# Patient Record
Sex: Female | Born: 1965 | Race: Black or African American | Hispanic: No | Smoking: Never smoker
Health system: Southern US, Community
[De-identification: ages and names within clinical notes are randomized; demographics above are authoritative.]

## PROBLEM LIST (undated history)

## (undated) DIAGNOSIS — I517 Cardiomegaly: Secondary | ICD-10-CM

## (undated) DIAGNOSIS — E119 Type 2 diabetes mellitus without complications: Secondary | ICD-10-CM

## (undated) DIAGNOSIS — N189 Chronic kidney disease, unspecified: Secondary | ICD-10-CM

## (undated) DIAGNOSIS — I10 Essential (primary) hypertension: Secondary | ICD-10-CM

## (undated) DIAGNOSIS — I160 Hypertensive urgency: Secondary | ICD-10-CM

## (undated) DIAGNOSIS — I119 Hypertensive heart disease without heart failure: Secondary | ICD-10-CM

## (undated) DIAGNOSIS — I890 Lymphedema, not elsewhere classified: Secondary | ICD-10-CM

## (undated) HISTORY — DX: Lymphedema, not elsewhere classified: I89.0

## (undated) HISTORY — DX: Chronic kidney disease, unspecified: N18.9

## (undated) HISTORY — DX: Cardiomegaly: I51.7

## (undated) HISTORY — DX: Hypertensive heart disease without heart failure: I11.9

## (undated) HISTORY — DX: Hypertensive urgency: I16.0

## (undated) HISTORY — PX: RHINOPLASTY: SUR1284

---

## 2000-05-20 ENCOUNTER — Inpatient Hospital Stay (HOSPITAL_COMMUNITY): Admission: AD | Admit: 2000-05-20 | Discharge: 2000-05-20 | Payer: Self-pay | Admitting: Obstetrics & Gynecology

## 2001-02-12 ENCOUNTER — Ambulatory Visit (HOSPITAL_COMMUNITY): Admission: RE | Admit: 2001-02-12 | Discharge: 2001-02-12 | Payer: Self-pay | Admitting: Family Medicine

## 2001-02-12 ENCOUNTER — Encounter: Payer: Self-pay | Admitting: Family Medicine

## 2001-03-17 ENCOUNTER — Emergency Department (HOSPITAL_COMMUNITY): Admission: EM | Admit: 2001-03-17 | Discharge: 2001-03-17 | Payer: Self-pay | Admitting: Emergency Medicine

## 2001-10-14 ENCOUNTER — Ambulatory Visit (HOSPITAL_COMMUNITY): Admission: RE | Admit: 2001-10-14 | Discharge: 2001-10-14 | Payer: Self-pay | Admitting: Family Medicine

## 2001-10-14 ENCOUNTER — Encounter: Payer: Self-pay | Admitting: Family Medicine

## 2001-10-22 ENCOUNTER — Ambulatory Visit (HOSPITAL_COMMUNITY): Admission: RE | Admit: 2001-10-22 | Discharge: 2001-10-22 | Payer: Self-pay | Admitting: Family Medicine

## 2001-10-22 ENCOUNTER — Encounter: Payer: Self-pay | Admitting: Family Medicine

## 2002-02-06 ENCOUNTER — Emergency Department (HOSPITAL_COMMUNITY): Admission: EM | Admit: 2002-02-06 | Discharge: 2002-02-06 | Payer: Self-pay | Admitting: Emergency Medicine

## 2002-02-06 ENCOUNTER — Encounter: Payer: Self-pay | Admitting: Emergency Medicine

## 2002-02-18 ENCOUNTER — Encounter: Payer: Self-pay | Admitting: Family Medicine

## 2002-02-18 ENCOUNTER — Ambulatory Visit (HOSPITAL_COMMUNITY): Admission: RE | Admit: 2002-02-18 | Discharge: 2002-02-18 | Payer: Self-pay | Admitting: Family Medicine

## 2003-10-01 ENCOUNTER — Ambulatory Visit (HOSPITAL_COMMUNITY): Admission: RE | Admit: 2003-10-01 | Discharge: 2003-10-01 | Payer: Self-pay | Admitting: *Deleted

## 2003-12-18 ENCOUNTER — Emergency Department (HOSPITAL_COMMUNITY): Admission: EM | Admit: 2003-12-18 | Discharge: 2003-12-18 | Payer: Self-pay | Admitting: Emergency Medicine

## 2004-01-05 ENCOUNTER — Ambulatory Visit (HOSPITAL_COMMUNITY): Admission: RE | Admit: 2004-01-05 | Discharge: 2004-01-05 | Payer: Self-pay | Admitting: Family Medicine

## 2004-01-19 ENCOUNTER — Emergency Department (HOSPITAL_COMMUNITY): Admission: EM | Admit: 2004-01-19 | Discharge: 2004-01-19 | Payer: Self-pay | Admitting: *Deleted

## 2008-11-20 ENCOUNTER — Ambulatory Visit: Payer: Self-pay | Admitting: Cardiology

## 2009-01-08 ENCOUNTER — Ambulatory Visit: Payer: Self-pay | Admitting: Cardiology

## 2010-10-16 ENCOUNTER — Encounter: Payer: Self-pay | Admitting: *Deleted

## 2011-02-07 NOTE — Assessment & Plan Note (Signed)
[facility]                          [facility] NOTE   NAME:[name], [name]                        MRN:          [id number]  DATE:01/08/2009                            DOB:          [birth year]    PRIMARY CARE PHYSICIAN:  Doreen Beam, MD   PRIMARY CARE PHYSICIAN:  Learta Codding, MD,FACC   REASON FOR VISIT:  Post hospital followup.   HISTORY OF PRESENT ILLNESS:  [name] [name] is a 45 year old woman seen by  Dr. Andee Lineman in consultation back in February with a history of chest pain  and palpitations in the setting of hypertension and diuretic use.  She  was noted to be hypokalemic at presentation and her cardiac markers  including troponin-I level and BNP were normal.  She also had a normal D-  dimer level.  She was referred for a 2-D echocardiogram, which revealed  normal left ventricular systolic function with an ejection fraction of  55-60% and no major valvular abnormalities.  Telemetry monitoring showed  no significant arrhythmia.  She was referred for a followup CardioNet  monitor, which revealed sinus rhythm with isolated premature ventricular  complex.  No sustained arrhythmias were noted.  She had a limited  episode of ventricular bigeminy.  In speaking with her about her  original presenting palpitations, she really describes more of a  sensation of fluttering and fast heart rate that was sustained and I  suppose it is possible that she had a supraventricular arrhythmia that  we did not capture.  In any event, she has remained symptomatically  stable since that time without any recurrence of chest pain or  palpitations and states that she feels back to baseline.  She also  states that Dr. Sherril Croon corrected her potassium with supplements.   ALLERGIES:  ACE INHIBITORS and CEFTIN.   PRESENT MEDICATIONS:  1. Triamterene 37.5 mg p.o. daily.  2. Metoprolol 50 mg p.o. b.i.d.  3. Amlodipine 10 mg p.o. daily.  4. Advil p.r.n.   REVIEW OF  SYSTEMS:  As outlined above.  Otherwise reviewed and negative.   PHYSICAL EXAMINATION:  VITAL SIGNS:  Blood pressure today is 136/86,  heart rate is 96, weight is 203 pounds.  GENERAL:  The patient is comfortable and in no acute distress.  NECK:  Examination of the neck shows no elevated jugular venous  pressure.  There is no thyromegaly.  LUNGS:  Clear without labored breathing.  CARDIAC:  Regular rate and rhythm.  No pathologic/systolic murmur or  pericardial rub.   IMPRESSION AND RECOMMENDATIONS:  History of palpitations and chest  discomfort with normal cardiac markers, normal left ventricular systolic  function without major valvular abnormalities on echocardiography, and  no sustained dysrhythmia is uncovered on outpatient CardioNet  monitoring.  She did have isolated premature ventricular complexes.  She  was hypokalemic on presentation, which may have contributed to some  myocardial irritability.  She states that this has been treated by Dr.  Sherril Croon subsequently and she has not had any recurrence of palpitations or  chest pain.  I have recommended observation and continuing medical  therapy.  If she develops any progressive/exertional symptoms, we can  certainly evaluate her further.  Followup will be as needed.     Jonelle Sidle, MD  Electronically Signed    SGM/MedQ  DD: 01/08/2009  DT: 01/09/2009  Job #: [id number]   cc:   Doreen Beam, MD

## 2011-05-03 DIAGNOSIS — R079 Chest pain, unspecified: Secondary | ICD-10-CM

## 2013-04-30 DIAGNOSIS — R030 Elevated blood-pressure reading, without diagnosis of hypertension: Secondary | ICD-10-CM

## 2014-05-07 DIAGNOSIS — I119 Hypertensive heart disease without heart failure: Secondary | ICD-10-CM | POA: Insufficient documentation

## 2014-05-07 DIAGNOSIS — R9431 Abnormal electrocardiogram [ECG] [EKG]: Secondary | ICD-10-CM | POA: Insufficient documentation

## 2015-06-01 ENCOUNTER — Other Ambulatory Visit (HOSPITAL_COMMUNITY): Payer: Self-pay | Admitting: Podiatry

## 2015-06-01 DIAGNOSIS — M6790 Unspecified disorder of synovium and tendon, unspecified site: Secondary | ICD-10-CM

## 2015-06-10 ENCOUNTER — Ambulatory Visit (HOSPITAL_COMMUNITY)
Admission: RE | Admit: 2015-06-10 | Discharge: 2015-06-10 | Disposition: A | Discharge status: Home / Self Care | Payer: BLUE CROSS/BLUE SHIELD | Source: Ambulatory Visit | Attending: Podiatry | Admitting: Podiatry

## 2015-06-10 DIAGNOSIS — M2141 Flat foot [pes planus] (acquired), right foot: Secondary | ICD-10-CM | POA: Diagnosis not present

## 2015-06-10 DIAGNOSIS — M6790 Unspecified disorder of synovium and tendon, unspecified site: Secondary | ICD-10-CM

## 2015-06-10 DIAGNOSIS — M19072 Primary osteoarthritis, left ankle and foot: Secondary | ICD-10-CM | POA: Insufficient documentation

## 2015-06-10 DIAGNOSIS — M25572 Pain in left ankle and joints of left foot: Secondary | ICD-10-CM | POA: Diagnosis not present

## 2015-06-10 DIAGNOSIS — M7660 Achilles tendinitis, unspecified leg: Secondary | ICD-10-CM | POA: Diagnosis not present

## 2015-10-22 ENCOUNTER — Other Ambulatory Visit: Payer: Self-pay | Admitting: *Deleted

## 2015-10-22 DIAGNOSIS — R609 Edema, unspecified: Secondary | ICD-10-CM

## 2015-12-03 ENCOUNTER — Encounter (HOSPITAL_COMMUNITY): Payer: Self-pay | Admitting: Emergency Medicine

## 2015-12-03 ENCOUNTER — Emergency Department (HOSPITAL_COMMUNITY)
Admission: EM | Admit: 2015-12-03 | Discharge: 2015-12-03 | Disposition: A | Discharge status: Home / Self Care | Payer: BLUE CROSS/BLUE SHIELD | Attending: Emergency Medicine | Admitting: Emergency Medicine

## 2015-12-03 DIAGNOSIS — R55 Syncope and collapse: Secondary | ICD-10-CM | POA: Diagnosis present

## 2015-12-03 DIAGNOSIS — I10 Essential (primary) hypertension: Secondary | ICD-10-CM | POA: Insufficient documentation

## 2015-12-03 DIAGNOSIS — E876 Hypokalemia: Secondary | ICD-10-CM | POA: Diagnosis not present

## 2015-12-03 DIAGNOSIS — E11649 Type 2 diabetes mellitus with hypoglycemia without coma: Secondary | ICD-10-CM | POA: Insufficient documentation

## 2015-12-03 DIAGNOSIS — Z79899 Other long term (current) drug therapy: Secondary | ICD-10-CM | POA: Diagnosis not present

## 2015-12-03 DIAGNOSIS — Z7984 Long term (current) use of oral hypoglycemic drugs: Secondary | ICD-10-CM | POA: Diagnosis not present

## 2015-12-03 DIAGNOSIS — E162 Hypoglycemia, unspecified: Secondary | ICD-10-CM

## 2015-12-03 HISTORY — DX: Essential (primary) hypertension: I10

## 2015-12-03 HISTORY — DX: Type 2 diabetes mellitus without complications: E11.9

## 2015-12-03 LAB — CBC WITH DIFFERENTIAL/PLATELET
BASOS ABS: 0 10*3/uL (ref 0.0–0.1)
BASOS PCT: 0 %
Eosinophils Absolute: 0 10*3/uL (ref 0.0–0.7)
Eosinophils Relative: 1 %
HEMATOCRIT: 35.3 % — AB (ref 36.0–46.0)
Hemoglobin: 10.9 g/dL — ABNORMAL LOW (ref 12.0–15.0)
LYMPHS PCT: 14 %
Lymphs Abs: 0.9 10*3/uL (ref 0.7–4.0)
MCH: 23.6 pg — ABNORMAL LOW (ref 26.0–34.0)
MCHC: 30.9 g/dL (ref 30.0–36.0)
MCV: 76.6 fL — ABNORMAL LOW (ref 78.0–100.0)
MONO ABS: 0.4 10*3/uL (ref 0.1–1.0)
Monocytes Relative: 5 %
NEUTROS ABS: 5.5 10*3/uL (ref 1.7–7.7)
NEUTROS PCT: 80 %
Platelets: 321 10*3/uL (ref 150–400)
RBC: 4.61 MIL/uL (ref 3.87–5.11)
RDW: 18.8 % — AB (ref 11.5–15.5)
WBC: 6.9 10*3/uL (ref 4.0–10.5)

## 2015-12-03 LAB — BASIC METABOLIC PANEL
ANION GAP: 10 (ref 5–15)
BUN: 15 mg/dL (ref 6–20)
CALCIUM: 8.6 mg/dL — AB (ref 8.9–10.3)
CO2: 27 mmol/L (ref 22–32)
Chloride: 100 mmol/L — ABNORMAL LOW (ref 101–111)
Creatinine, Ser: 1.13 mg/dL — ABNORMAL HIGH (ref 0.44–1.00)
GFR, EST NON AFRICAN AMERICAN: 57 mL/min — AB (ref >60)
GLUCOSE: 183 mg/dL — AB (ref 65–99)
POTASSIUM: 3.4 mmol/L — AB (ref 3.5–5.1)
Sodium: 137 mmol/L (ref 135–145)

## 2015-12-03 LAB — CBG MONITORING, ED
Glucose-Capillary: 126 mg/dL — ABNORMAL HIGH (ref 65–99)
Glucose-Capillary: 156 mg/dL — ABNORMAL HIGH (ref 65–99)

## 2015-12-03 NOTE — Discharge Instructions (Signed)

## 2015-12-03 NOTE — ED Notes (Signed)
Pt states she felt like her sugar dropped, pt better, just sleepy. Family at the bedside.

## 2015-12-03 NOTE — ED Notes (Signed)
Order meal tray per MD

## 2015-12-03 NOTE — ED Notes (Signed)
Patient was at work and had a loss of consciousness. Felt as if she was having a hypoglycemia episode. Was given juice and a glucose tablet. Patient is Diabetic and takes metformin. Reports eating breakfast and taking metformin per her normal routine. States she felt as if she was getting a cold today. States headache.

## 2015-12-03 NOTE — ED Provider Notes (Signed)
CSN: [id number]     Arrival date & time 12/03/15  1152 History  By signing my name below, I, Marica OtterNusrat Rahman, attest that this documentation has been prepared under the direction and in the presence of No att. providers found. Electronically Signed: Marica OtterNusrat Rahman, ED Scribe. 12/03/2015. 12:35 PM.  Chief Complaint  Patient presents with  . Loss of Consciousness   The history is provided by the patient. No language interpreter was used.   PCP: Ignatius SpeckingVYAS,DHRUV B., MD HPI Comments: [name] [name] is a 50 y.o. female, with PMHx noted below including DM (treated with Metformin) and HTN, who presents to the Emergency Department complaining of one episode of syncope this morning while at work. Associated Sx include headache. Pt reports that immediately preceding losing consciousness she felt dizzy and felt like she was having a hypoglycemic episode. Pt reports drinking orange juice and taking a glucose tab after regaining consciousness. Pt reports eating breakfast this morning and taking her metformin per her daily morning routine. Pt notes she has also been coughing for the past couple of days. Pt denies fever, chest pain, dysuria, confusion, frequency, difficulty urinating. Pt further denies any recent medication changes.   Past Medical History  Diagnosis Date  . Diabetes mellitus without complication (HCC)   . Hypertension    Past Surgical History  Procedure Laterality Date  . Rhinoplasty     No family history on file. Social History  Substance Use Topics  . Smoking status: Never Smoker   . Smokeless tobacco: None  . Alcohol Use: No   OB History    No data available     Review of Systems  Constitutional: Negative for fever.  Respiratory: Positive for cough.   Cardiovascular: Negative for chest pain.  Genitourinary: Negative for dysuria, frequency and difficulty urinating.  Neurological: Positive for syncope and headaches.  Psychiatric/Behavioral: Negative for confusion.  All other systems  reviewed and are negative.  Allergies  Ceftin and Ace inhibitors  Home Medications   Prior to Admission medications   Medication Sig Start Date End Date Taking? Authorizing Provider  amLODipine (NORVASC) 10 MG tablet Take 10 mg by mouth daily.   Yes Historical Provider, MD  cloNIDine (CATAPRES - DOSED IN MG/24 HR) 0.2 mg/24hr patch Place 1 patch onto the skin once a week. 10/05/15  Yes Historical Provider, MD  fluticasone (FLONASE) 50 MCG/ACT nasal spray Place 1 spray into both nostrils daily as needed for allergies or rhinitis.   Yes Historical Provider, MD  metFORMIN (GLUCOPHAGE) 500 MG tablet Take 500 mg by mouth 2 (two) times daily with a meal.   Yes Historical Provider, MD  metoprolol (LOPRESSOR) 50 MG tablet Take 50 mg by mouth 2 (two) times daily. 09/25/15  Yes Historical Provider, MD  PENNSAID 2 % SOLN Apply 2 application topically daily as needed. 11/11/15  Yes Historical Provider, MD  torsemide (DEMADEX) 20 MG tablet Take 1 tablet by mouth daily as needed (fluid).  09/25/15   Historical Provider, MD   Triage Vitals: BP 110/64 mmHg  Pulse 77  Temp(Src) 98.6 F (37 C) (Oral)  Resp 16  Ht 5\' 3"  (1.6 m)  Wt 215 lb (97.523 kg)  BMI 38.09 kg/m2  SpO2 100% Physical Exam  Constitutional: She is oriented to person, place, and time. She appears well-developed and well-nourished. No distress.  Appears fatigued.   HENT:  Head: Normocephalic.  Eyes: EOM are normal.  Neck: Normal range of motion.  Cardiovascular: Normal rate, regular rhythm and normal heart sounds.  Pulmonary/Chest: Effort normal and breath sounds normal. No respiratory distress.  Abdominal: She exhibits no distension.  Musculoskeletal: Normal range of motion.  Neurological: She is alert and oriented to person, place, and time.  Psychiatric: She has a normal mood and affect.  Nursing note and vitals reviewed.  ED Course  Procedures (including critical care time) DIAGNOSTIC STUDIES: Oxygen Saturation is 100% on  ra, nl by my interpretation.    COORDINATION OF CARE: 12:31 PM: Discussed treatment plan which includes labs and EKG results with pt at bedside; patient verbalizes understanding and agrees with treatment plan.  Labs Review Labs Reviewed  BASIC METABOLIC PANEL - Abnormal; Notable for the following:    Potassium 3.4 (*)    Chloride 100 (*)    Glucose, Bld 183 (*)    Creatinine, Ser 1.13 (*)    Calcium 8.6 (*)    GFR calc non Af Amer 57 (*)    All other components within normal limits  CBC WITH DIFFERENTIAL/PLATELET - Abnormal; Notable for the following:    Hemoglobin 10.9 (*)    HCT 35.3 (*)    MCV 76.6 (*)    MCH 23.6 (*)    RDW 18.8 (*)    All other components within normal limits  CBG MONITORING, ED - Abnormal; Notable for the following:    Glucose-Capillary 126 (*)    All other components within normal limits  CBG MONITORING, ED - Abnormal; Notable for the following:    Glucose-Capillary 156 (*)    All other components within normal limits   I have personally reviewed and evaluated these lab results as part of my medical decision-making.   EKG Interpretation   Date/Time:  Friday December 03 2015 12:19:20 EST Ventricular Rate:  76 PR Interval:  191 QRS Duration: 72 QT Interval:  471 QTC Calculation: 530 R Axis:   23 Text Interpretation:  Sinus rhythm Left atrial enlargement Left  ventricular hypertrophy Borderline T abnormalities, anterior leads  Prolonged QT interval Confirmed by Rubin Payor  MD, Harrold Donath [id number]) on  12/03/2015 12:33:32 PM      MDM   Final diagnoses:  Hypoglycemia  Hypokalemia    Patient with likely hypoglycemia. Mild hypokalemia. Feels beter after treatment. Will d/c  I personally performed the services described in this documentation, which was scribed in my presence. The recorded information has been reviewed and is accurate.      Benjiman Core, MD 12/03/15 2221

## 2015-12-03 NOTE — ED Notes (Signed)
MD at bedside. 

## 2015-12-03 NOTE — ED Notes (Signed)
Patient given discharge instruction, verbalized understand. IV removed, band aid applied. Patient ambulatory out of the department.  

## 2015-12-03 NOTE — ED Notes (Signed)
Pt eating meal tray 

## 2015-12-08 ENCOUNTER — Encounter: Payer: Self-pay | Admitting: Vascular Surgery

## 2015-12-14 ENCOUNTER — Encounter (HOSPITAL_COMMUNITY): Payer: BLUE CROSS/BLUE SHIELD

## 2015-12-14 ENCOUNTER — Encounter: Payer: BLUE CROSS/BLUE SHIELD | Admitting: Vascular Surgery

## 2016-04-07 ENCOUNTER — Encounter: Payer: Self-pay | Admitting: Vascular Surgery

## 2016-04-12 ENCOUNTER — Ambulatory Visit (INDEPENDENT_AMBULATORY_CARE_PROVIDER_SITE_OTHER): Payer: BLUE CROSS/BLUE SHIELD | Admitting: Vascular Surgery

## 2016-04-12 ENCOUNTER — Ambulatory Visit (HOSPITAL_COMMUNITY)
Admission: RE | Admit: 2016-04-12 | Discharge: 2016-04-12 | Disposition: A | Discharge status: Home / Self Care | Payer: BLUE CROSS/BLUE SHIELD | Source: Ambulatory Visit | Attending: Vascular Surgery | Admitting: Vascular Surgery

## 2016-04-12 ENCOUNTER — Encounter: Payer: Self-pay | Admitting: Vascular Surgery

## 2016-04-12 VITALS — BP 160/103 | HR 98 | Temp 97.3°F | Resp 14 | Ht 67.0 in | Wt 223.0 lb

## 2016-04-12 DIAGNOSIS — I872 Venous insufficiency (chronic) (peripheral): Secondary | ICD-10-CM | POA: Diagnosis not present

## 2016-04-12 DIAGNOSIS — R609 Edema, unspecified: Secondary | ICD-10-CM | POA: Insufficient documentation

## 2016-04-12 DIAGNOSIS — I8393 Asymptomatic varicose veins of bilateral lower extremities: Secondary | ICD-10-CM | POA: Insufficient documentation

## 2016-04-12 DIAGNOSIS — I10 Essential (primary) hypertension: Secondary | ICD-10-CM | POA: Insufficient documentation

## 2016-04-12 DIAGNOSIS — E119 Type 2 diabetes mellitus without complications: Secondary | ICD-10-CM | POA: Diagnosis not present

## 2016-04-12 NOTE — Progress Notes (Signed)
[facility] [place]  Patient name: [name] [name] MRN: [id number] DOB: [birth year] Sex: female  REASON FOR CONSULT: Bilateral lower extremity edema. Referred by Dr. Nolen Mu.  HPI: [name] [name] is a 50 y.o. female, who is referred for evaluation of bilateral lower extremity swelling. She works at a job that requires her to stand for a prolonged period of time and has gradually noted increasing swelling in both lower extremities. The left leg has been or significant than the right. She denies any previous history of DVT or phlebitis. She describes aching pain and heaviness associated with standing and relieved somewhat with elevation. There are no other aggravating or alleviating factors.  I have reviewed the records that were sent with her. She does have a history of foot pain. She has posterior tibial tendon dysfunction.  Past Medical History  Diagnosis Date  . Diabetes mellitus without complication (HCC)   . Hypertension     Family History  Problem Relation Age of Onset  . Diabetes Mother   . Kidney disease Mother   . Diabetes Father   . Heart disease Father     SOCIAL HISTORY: Social History   Social History  . Marital Status: Legally Separated    Spouse Name: N/A  . Number of Children: N/A  . Years of Education: N/A   Occupational History  . Not on file.   Social History Main Topics  . Smoking status: Never Smoker   . Smokeless tobacco: Not on file  . Alcohol Use: No  . Drug Use: No  . Sexual Activity: Not on file   Other Topics Concern  . Not on file   Social History Narrative    Allergies  Allergen Reactions  . Ace Inhibitors Swelling and Shortness Of Breath  . Cefoxitin Shortness Of Breath  . Ceftin [Cefuroxime Axetil] Anaphylaxis    Current Outpatient Prescriptions  Medication Sig Dispense Refill  . acetaminophen (TYLENOL) 500 MG tablet Take 500 mg by mouth as needed.    Marland Kitchen amLODipine (NORVASC) 10 MG tablet Take 10 mg by  mouth daily.    . cloNIDine (CATAPRES - DOSED IN MG/24 HR) 0.2 mg/24hr patch Place 1 patch onto the skin once a week.  12  . fluticasone (FLONASE) 50 MCG/ACT nasal spray Place 1 spray into both nostrils daily as needed for allergies or rhinitis.    Marland Kitchen HYDROcodone-acetaminophen (NORCO/VICODIN) 5-325 MG tablet TAKE 1 TABLET BY MOUTH FOUR TIMES A DAY AS NEEDED  0  . Ibuprofen (ADVIL) 200 MG CAPS Take 200 mg by mouth as needed.    . metFORMIN (GLUCOPHAGE) 500 MG tablet Take 500 mg by mouth 2 (two) times daily with a meal.    . metoprolol (LOPRESSOR) 50 MG tablet Take 50 mg by mouth 2 (two) times daily.  1  . PENNSAID 2 % SOLN Apply 2 application topically daily as needed.  0  . torsemide (DEMADEX) 20 MG tablet Take 1 tablet by mouth daily as needed (fluid).   2   No current facility-administered medications for this visit.    REVIEW OF SYSTEMS:   denotes positive finding,  denotes negative finding Cardiac  Comments:  Chest pain or chest pressure:    Shortness of breath upon exertion:    Short of breath when lying flat:    Irregular heart rhythm:        Vascular    Pain in calf, thigh, or hip brought on by ambulation:    Pain in  feet at night that wakes you up from your sleep:     Blood clot in your veins:    Leg swelling:  X       Pulmonary    Oxygen at home:    Productive cough:     Wheezing:         Neurologic    Sudden weakness in arms or legs:     Sudden numbness in arms or legs:     Sudden onset of difficulty speaking or slurred speech:    Temporary loss of vision in one eye:     Problems with dizziness:         Gastrointestinal    Blood in stool:     Vomited blood:         Genitourinary    Burning when urinating:     Blood in urine:        Psychiatric    Major depression:         Hematologic    Bleeding problems:    Problems with blood clotting too easily:        Skin    Rashes or ulcers: X       Constitutional    Fever or chills:      PHYSICAL  EXAM: Filed Vitals:   04/12/16 1300 04/12/16 1304  BP: 187/107 160/103  Pulse: 97 98  Temp: 97.3 F (36.3 C)   Resp: 14   Height: 5\' 7"  (1.702 m)   Weight: 223 lb (101.152 kg)   SpO2: 98%     GENERAL: The patient is a well-nourished female, in no acute distress. The vital signs are documented above. CARDIAC: There is a regular rate and rhythm.  VASCULAR: not detect carotid bruits. He has palpable pedal pulses. She has mild bilateral lower extremity swelling. PULMONARY: There is good air exchange bilaterally without wheezing or rales. ABDOMEN: Soft and non-tender with normal pitched bowel sounds.  MUSCULOSKELETAL: There are no major deformities or cyanosis. NEUROLOGIC: No focal weakness or paresthesias are detected. SKIN: There are no ulcers or rashes noted. PSYCHIATRIC: The patient has a normal affect.  DATA:   LOWER EXTREMITY VENOUS DUPLEX: I have independently interpreted her lower extremity venous duplex scan done today.  On the right side, there is no evidence of DVT or superficial thrombophlebitis. There is reflux in the deep system involving the right common femoral vein. There is also reflux in the right small saphenous vein.  On the left side, there is no evidence of DVT or superficial thrombophlebitis. There is reflux in the deep system involving the common femoral vein and femoral vein on the left. There is no reflux in the great saphenous vein or small saphenous vein on the left.  MEDICAL ISSUES:  CHRONIC VENOUS INSUFFICIENCY: Based on her duplex study she has evidence of bilateral chronic venous insufficiency. This patient has significant chronic venous insufficiency. I've explained that this is a chronic problem. We have discussed the importance of intermittent leg elevation and the proper positioning for this. In addition, I have discussed the importance of wearing compression stockings, she has, when she will be standing or sitting for a long time.  I have encouraged  her to stay as active as possible and to avoid prolonged sitting and standing. We have also discussed water aerobics which I think is also very helpful for people with chronic venous insufficiency. I will be happy to see her back at anytime if any new vascular issues arise.  Waverly Ferrari  [facility] [place] [facility] [phone]

## 2016-04-12 NOTE — Progress Notes (Signed)
Filed Vitals:   04/12/16 1300 04/12/16 1304  BP: 187/107 160/103  Pulse: 97 98  Temp: 97.3 F (36.3 C)   Resp: 14   Height: 5\' 7"  (1.702 m)   Weight: 223 lb (101.152 kg)   SpO2: 98%

## 2017-06-15 ENCOUNTER — Encounter: Payer: Self-pay | Admitting: Orthopedic Surgery

## 2017-06-15 ENCOUNTER — Telehealth: Payer: Self-pay | Admitting: Orthopedic Surgery

## 2017-06-15 ENCOUNTER — Ambulatory Visit (INDEPENDENT_AMBULATORY_CARE_PROVIDER_SITE_OTHER): Payer: Worker's Compensation | Admitting: Orthopedic Surgery

## 2017-06-15 VITALS — BP 198/116 | HR 76 | Ht 64.0 in | Wt 216.0 lb

## 2017-06-15 DIAGNOSIS — S83242A Other tear of medial meniscus, current injury, left knee, initial encounter: Secondary | ICD-10-CM | POA: Diagnosis not present

## 2017-06-15 DIAGNOSIS — S8002XA Contusion of left knee, initial encounter: Secondary | ICD-10-CM | POA: Diagnosis not present

## 2017-06-15 NOTE — Patient Instructions (Signed)
Meniscus Injury, Arthroscopy Arthroscopy is a surgical procedure that involves the use of a small scope that has a camera and surgical instruments on the end (arthroscope). An arthroscope can be used to repair your meniscus injury.  LET YOUR HEALTH CARE PROVIDER KNOW ABOUT:  Any allergies you have.  All medicines you are taking, including vitamins, herbs, eyedrops, creams, and over-the-counter medicines.  Any recent colds or infections you have had or currently have.  Previous problems you or members of your family have had with the use of anesthetics.  Any blood disorders or blood clotting problems you have.  Previous surgeries you have had.  Medical conditions you have. RISKS AND COMPLICATIONS Generally, this is a safe procedure. However, as with any procedure, problems can occur. Possible problems include:  Damage to nerves or blood vessels.  Excess bleeding.  Blood clots.  Infection. BEFORE THE PROCEDURE  Do not eat or drink for 6-8 hours before the procedure.  Take medicines as directed by your surgeon. Ask your surgeon about changing or stopping your regular medicines.  You may have lab tests the morning of surgery. PROCEDURE  You will be given one of the following:   A medicine that numbs the area (local anesthesia).  A medicine that makes you go to sleep (general anesthesia).  A medicine injected into your spine that numbs your body below the waist (spinal anesthesia). Most often, several small cuts (incisions) are made in the knee. The arthroscope and instruments go into the incisions to repair the damage. The torn portion of the meniscus is removed.   AFTER THE PROCEDURE  You will be taken to the recovery area where your progress will be monitored. When you are awake, stable, and taking fluids without complications, you will be allowed to go home. This is usually the same day. A torn or stretched ligament (ligament sprain) may take 6-8 weeks to heal.   It  takes about the 4-6 WEEKS if your surgeon removed a torn meniscus.  A repaired meniscus may require 6-12 weeks of recovery time.  A torn ligament needing reconstructive surgery may take 6-12 months to heal fully.   This information is not intended to replace advice given to you by your health care provider. Make sure you discuss any questions you have with your health care provider. You have decided to proceed with operative arthroscopy of the knee. You have decided not to continue with nonoperative measures such as but not limited to oral medication, weight loss, activity modification, physical therapy, bracing, or injection.  We will perform operative arthroscopy of the knee. Some of the risks associated with arthroscopic surgery of the knee include but are not limited to Bleeding Infection Swelling Stiffness Blood clot Pain  If you're not comfortable with these risks and would like to continue with nonoperative treatment please let Dr. Tae Robak know prior to your surgery.   Document Released: 09/08/2000 Document Revised: 09/16/2013 Document Reviewed: 02/07/2013 Elsevier Interactive Patient Education 2016 [employer] You have decided to proceed with operative arthroscopy of the knee. You have decided not to continue with nonoperative measures such as but not limited to oral medication, weight loss, activity modification, physical therapy, bracing, or injection.  We will perform operative arthroscopy of the knee. Some of the risks associated with arthroscopic surgery of the knee include but are not limited to Bleeding Infection Swelling Stiffness Blood clot Pain  If you're not comfortable with these risks and would like to continue with nonoperative treatment please let Dr. Antonis Lor   know prior to your surgery. 

## 2017-06-15 NOTE — Addendum Note (Signed)
Addended by: Fuller Canada E on: 06/15/2017 11:37 AM   Modules accepted: Orders, SmartSet

## 2017-06-15 NOTE — Telephone Encounter (Signed)
-----   Message from Vickki Hearing, MD sent at 06/15/2017  9:53 AM EDT ----- Schedule surgery

## 2017-06-15 NOTE — Progress Notes (Signed)
Patient ID: [name] [name], female   DOB: 1966/09/09, 51 y.o.   MRN: [id number]   Chief Complaint  Patient presents with  . Knee Injury    Left knee pain, DOI 04-16-17. Workers compensation    [name] [name] is a 51 y.o. female.   51 year old female presents with a left knee injury sustained at work on generalized 23rd she tripped over some debris in the plant and fell onto her left knee.  She is on light duty from 6 July 23 through today. She's had ibuprofen ice and Aleve she's had an MRI in her MRI shows that she has a torn medial meniscus as well as injury to Hoffa's fat pad inferior pole of patella ligaments are normal  Currently primary symptoms include anterior knee pain aching throbbing during the day worse at night  Mild medial compartment pain as well. Constant pain is noted dull aching sensation noted.    Review of Systems Review of Systems  Cardiovascular: Positive for leg swelling.  Musculoskeletal: Positive for joint pain.  All other systems reviewed and are negative.   Past Medical History:  Diagnosis Date  . Diabetes mellitus without complication (HCC)   . Hypertension     Past Surgical History:  Procedure Laterality Date  . CESAREAN SECTION  2001  . RHINOPLASTY      Allergies  Allergen Reactions  . Ace Inhibitors Swelling and Shortness Of Breath  . Cefoxitin Shortness Of Breath  . Ceftin [Cefuroxime Axetil] Anaphylaxis    Current Outpatient Prescriptions  Medication Sig Dispense Refill  . acetaminophen (TYLENOL) 500 MG tablet Take 500 mg by mouth as needed.    Marland Kitchen amLODipine (NORVASC) 10 MG tablet Take 10 mg by mouth daily.    . cloNIDine (CATAPRES - DOSED IN MG/24 HR) 0.2 mg/24hr patch Place 1 patch onto the skin once a week.  12  . fluticasone (FLONASE) 50 MCG/ACT nasal spray Place 1 spray into both nostrils daily as needed for allergies or rhinitis.    Marland Kitchen HYDROcodone-acetaminophen (NORCO/VICODIN) 5-325 MG tablet TAKE 1 TABLET BY MOUTH FOUR TIMES A  DAY AS NEEDED  0  . Ibuprofen (ADVIL) 200 MG CAPS Take 200 mg by mouth as needed.    . metFORMIN (GLUCOPHAGE) 500 MG tablet Take 500 mg by mouth 2 (two) times daily with a meal.    . metoprolol (LOPRESSOR) 50 MG tablet Take 50 mg by mouth 2 (two) times daily.  1  . PENNSAID 2 % SOLN Apply 2 application topically daily as needed.  0  . torsemide (DEMADEX) 20 MG tablet Take 1 tablet by mouth daily as needed (fluid).   2   No current facility-administered medications for this visit.      Physical Exam BP (!) 198/116   Pulse 76   Ht  (1.626 m)   Wt 216 lb (98 kg)   BMI 37.08 kg/m  Physical Exam   The patient is well developed well nourished and well groomed.   Orientation to person place and time is normal   Mood is pleasant. Affect normal  Ambulatory status is remarkable for a limp in the involved extremity         Left Knee examination:  Inspection: Tenderness is noted over the medial joint line   ROM: Is limited by pain with a maximum flexion arc of   St 130ability: Collateral ligaments are stable, the Lachman test and anterior and posterior drawer tests are normal   We do palpate medial joint  line tenderness and a positive McMurray's for medial meniscal tear, we also find tenderness in the inferior pole patella medial and lateral side and patellar tendon    Motor exam: Grade 5 motor strength in the quadriceps musculature   Skin: Warm dry and intact over the right leg                       Neuro: normal sensation   Vascular: 2+ DP pulse with normal color and no edema.   Currently theright lower extremity and knee examination revealed no tenderness or swelling, full range of motion without contracture subluxation atrophy or tremor. Normal muscle tone no instability and the neurovascular status of the limb is normal.    Assessment and Plan:  IMAGING: I have read and interpret the MRI scan shows a probable medial meniscus tear. There is some retropatellar  tendon and Hoffa's fat pad injury as well   Diagnosis and treatment:  Encounter Diagnoses  Name Primary?  . Acute medial meniscus tear of left knee, initial encounter Yes  . Contusion of left patella, initial encounter       Fuller Canada, MD 06/15/2017 9:08 AM

## 2017-06-15 NOTE — Pre-Procedure Instructions (Signed)
error 

## 2017-06-21 ENCOUNTER — Telehealth: Payer: Self-pay | Admitting: Orthopedic Surgery

## 2017-06-21 NOTE — Telephone Encounter (Signed)
On Wednesday 06-20-17, [name] [name] came by and gave [place] the name of a new WC adjustor for her.  The adjustor's name is [name] [name] and her phone number is [phone].  Patient's nurse case manager is [name] phone # [phone].   I called [name] [name] this morning and asked her to call and give approval for pt's preop appointment tomorrow, 06-22-17 at 12:45 and her surgery on 06-28-17.   I had a voicemail left during lunch from [name] nurse case Production designer, theatre/television/film.  She said the case was under review.    I called [name] to clarify as to whether or not to cancel pt's pre op appointment or not.  I could not speak to her but did speak to another representative, [name].  She got through to [name] who stated there was a pending referral to [name].  Per [name], pre op appointment needs to be canceled.  I called and spoke to [name] [phone]).  I let her know what is going on with this WC case at the present time.  She has canceled pre op for tomorrow.  We will have to await further word from [name] to see if new pre op appointment and surgery need to be canceled or proceed as planned.

## 2017-06-21 NOTE — Pre-Procedure Instructions (Signed)
Spoke with Dr Romeo Apple about order for obtain consent. Order entered per his instructions.

## 2017-06-21 NOTE — Patient Instructions (Signed)
[name] [name]  06/21/2017     @   Your procedure is scheduled on  06/28/2017   Report to [facility] at  725  A.M.  Call this number if you have problems the morning of surgery:  [phone]   Remember:  Do not eat food or drink liquids after midnight.  Take these medicines the morning of surgery with A SIP OF WATER  Norvasc, hydrocodone, metoprolol.   Do not wear jewelry, make-up or nail polish.  Do not wear lotions, powders, or perfumes, or deoderant.  Do not shave 48 hours prior to surgery.  Men may shave face and neck.  Do not bring valuables to the hospital.  [facility] is not responsible for any belongings or valuables.  Contacts, dentures or bridgework may not be worn into surgery.  Leave your suitcase in the car.  After surgery it may be brought to your room.  For patients admitted to the hospital, discharge time will be determined by your treatment team.  Patients discharged the day of surgery will not be allowed to drive home.   Name and phone number of your driver:   family Special instructions:  None  Please read over the following fact sheets that you were given. Anesthesia Post-op Instructions and Care and Recovery After Surgery      Knee Ligament Injury, Arthroscopy Arthroscopy is a surgical technique in which your health care provider examines your knee through a small, pencil-sized telescope (arthroscope). Often, repairs to injured ligaments can be done with instruments in the arthroscope. Arthroscopy is less invasive than open-knee surgery. Tell a health care provider about:  Any allergies you have.  All medicines you are taking, including vitamins, herbs, eye drops, creams, and over-the-counter medicines.  Any problems you or family members have had with anesthetic medicines.  Any blood disorders you have.  Any surgeries you have had.  Any medical conditions you have. What are the risks? Generally, this is a  safe procedure. However, as with any procedure, problems can occur. Possible problems include:  Infection.  Bleeding.  Stiffness.  What happens before the procedure?  Ask your health care provider about changing or stopping any regular medicines. Avoid taking aspirin or blood thinners as directed by your health care provider.  Do not eat or drink anything after midnight the night before surgery.  If you smoke, do not smoke for at least 2 weeks before your surgery.  Do not drink alcohol starting the day before your surgery.  Let your health care provider know if you develop a cold or any infection before your surgery.  Arrange for someone to drive you home after the surgery or after your hospital stay. Also arrange for someone to help you with activities during recovery. What happens during the procedure?  Small monitors will be put on your body. They are used to check your heart, blood pressure, and oxygen levels.  An IV access tube will be put into one of your veins. Medicine will be able to flow directly into your body through this IV tube.  You might be given a medicine to help you relax (sedative).  You will be given a medicine that makes you go to sleep (general anesthetic), and a breathing tube will be placed into your lungs during the procedure.  Several small incisions are made in your knee. Saline fluid is placed into one of the incisions to expand the knee and clear  away any blood in the knee.  Your health care provider will insert the arthroscope to examine the injured knee.  During arthroscopy, your health care provider may find a partial or complete tear in a ligament.  Tools can be inserted through the other incisions to repair the injured ligaments.  The incisions are then closed with absorbable stitches and covered with dressings. What happens after the procedure?  You will be taken to the recovery area where you will be monitored.  When you are awake,  stable, and taking fluids without problems, you will be allowed to go home. This information is not intended to replace advice given to you by your health care provider. Make sure you discuss any questions you have with your health care provider. Document Released: 09/08/2000 Document Revised: 02/17/2016 Document Reviewed: 04/23/2013 Elsevier Interactive Patient Education  2017 Elsevier Inc.  Arthroscopic Knee Ligament Repair, Care After This sheet gives you information about how to care for yourself after your procedure. Your health care provider may also give you more specific instructions. If you have problems or questions, contact your health care provider. What can I expect after the procedure? After the procedure, it is common to have:  Pain in your knee.  Bruising and swelling on your knee, calf, and ankle for 3-4 days.  Fatigue.  Follow these instructions at home: If you have a brace or immobilizer:  Wear the brace or immobilizer as told by your health care provider. Remove it only as told by your health care provider.  Loosen the splint or immobilizer if your toes tingle, become numb, or turn cold and blue.  Keep the brace or immobilizer clean. Bathing  Do not take baths, swim, or use a hot tub until your health care provider approves. Ask your health care provider if you can take showers.  Keep your bandage (dressing) dry until your health care provider says that it can be removed. Cover it and your brace or immobilizer with a watertight covering when you take a shower. Incision care  Follow instructions from your health care provider about how to take care of your incision. Make sure you: ? Wash your hands with soap and water before you change your bandage (dressing). If soap and water are not available, use hand sanitizer. ? Change your dressing as told by your health care provider. ? Leave stitches (sutures), skin glue, or adhesive strips in place. These skin closures  may need to stay in place for 2 weeks or longer. If adhesive strip edges start to loosen and curl up, you may trim the loose edges. Do not remove adhesive strips completely unless your health care provider tells you to do that.  Check your incision area every day for signs of infection. Check for: ? More redness, swelling, or pain. ? More fluid or blood. ? Warmth. ? Pus or a bad smell. Managing pain, stiffness, and swelling  If directed, put ice on the affected area. ? If you have a removable brace or immobilizer, remove it as told by your health care provider. ? Put ice in a plastic bag. ? Place a towel between your skin and the bag or between your brace or immobilizer and the bag. ? Leave the ice on for 20 minutes, 2-3 times a day.  Move your toes often to avoid stiffness and to lessen swelling.  Raise (elevate) the injured area above the level of your heart while you are sitting or lying down. Driving  Do not drive until  your health care provider approves. If you have a brace or immobilizer on your leg, ask your health care provider when it is safe for you to drive.  Do not drive or use heavy machinery while taking prescription pain medicine. Activity  Rest as directed. Ask your health care provider what activities are safe for you.  Do physical therapy exercises as told by your health care provider. Physical therapy will help you regain strength and motion in your knee.  Follow instructions from your health care provider about: ? When you may start motion exercises. ? When you may start riding a stationary bike and doing other low-impact activities. ? When you may start to jog and do other high-impact activities. Safety  Do not use the injured limb to support your body weight until your health care provider says that you can. Use crutches as told by your health care provider. General instructions  Do not use any products that contain nicotine or tobacco, such as cigarettes  and e-cigarettes. These can delay bone healing. If you need help quitting, ask your health care provider.  To prevent or treat constipation while you are taking prescription pain medicine, your health care provider may recommend that you: ? Drink enough fluid to keep your urine clear or pale yellow. ? Take over-the-counter or prescription medicines. ? Eat foods that are high in fiber, such as fresh fruits and vegetables, whole grains, and beans. ? Limit foods that are high in fat and processed sugars, such as fried and sweet foods.  Take over-the-counter and prescription medicines only as told by your health care provider.  Keep all follow-up visits as told by your health care provider. This is important. Contact a health care provider if:  You have more redness, swelling, or pain around an incision.  You have more fluid or blood coming from an incision.  Your incision feels warm to the touch.  You have a fever.  You have pain or swelling in your knee, and it gets worse.  You have pain that does not get better with medicine. Get help right away if:  You have trouble breathing.  You have pus or a bad smell coming from an incision.  You have numbness and tingling near the knee joint. Summary  After the procedure, it is common to have knee pain with bruising and swelling on your knee, calf, and ankle.  Icing your knee and raising your leg above the level of your heart will help control the pain and the swelling.  Do physical therapy exercises as told by your health care provider. Physical therapy will help you regain strength and motion in your knee. This information is not intended to replace advice given to you by your health care provider. Make sure you discuss any questions you have with your health care provider. Document Released: 07/02/2013 Document Revised: 09/05/2016 Document Reviewed: 09/05/2016 Elsevier Interactive Patient Education  2017 Elsevier Inc.  General  Anesthesia, Adult General anesthesia is the use of medicines to make a person "go to sleep" (be unconscious) for a medical procedure. General anesthesia is often recommended when a procedure:  Is long.  Requires you to be still or in an unusual position.  Is major and can cause you to lose blood.  Is impossible to do without general anesthesia.  The medicines used for general anesthesia are called general anesthetics. In addition to making you sleep, the medicines:  Prevent pain.  Control your blood pressure.  Relax your muscles.  Tell  a health care provider about:  Any allergies you have.  All medicines you are taking, including vitamins, herbs, eye drops, creams, and over-the-counter medicines.  Any problems you or family members have had with anesthetic medicines.  Types of anesthetics you have had in the past.  Any bleeding disorders you have.  Any surgeries you have had.  Any medical conditions you have.  Any history of heart or lung conditions, such as heart failure, sleep apnea, or chronic obstructive pulmonary disease (COPD).  Whether you are pregnant or may be pregnant.  Whether you use tobacco, alcohol, marijuana, or street drugs.  Any history of Armed forces logistics/support/administrative officer.  Any history of depression or anxiety. What are the risks? Generally, this is a safe procedure. However, problems may occur, including:  Allergic reaction to anesthetics.  Lung and heart problems.  Inhaling food or liquids from your stomach into your lungs (aspiration).  Injury to nerves.  Waking up during your procedure and being unable to move (rare).  Extreme agitation or a state of mental confusion (delirium) when you wake up from the anesthetic.  Air in the bloodstream, which can lead to stroke.  These problems are more likely to develop if you are having a major surgery or if you have an advanced medical condition. You can prevent some of these complications by answering all of  your health care provider's questions thoroughly and by following all pre-procedure instructions. General anesthesia can cause side effects, including:  Nausea or vomiting  A sore throat from the breathing tube.  Feeling cold or shivery.  Feeling tired, washed out, or achy.  Sleepiness or drowsiness.  Confusion or agitation.  What happens before the procedure? Staying hydrated Follow instructions from your health care provider about hydration, which may include:  Up to 2 hours before the procedure - you may continue to drink clear liquids, such as water, clear fruit juice, black coffee, and plain tea.  Eating and drinking restrictions Follow instructions from your health care provider about eating and drinking, which may include:  8 hours before the procedure - stop eating heavy meals or foods such as meat, fried foods, or fatty foods.  6 hours before the procedure - stop eating light meals or foods, such as toast or cereal.  6 hours before the procedure - stop drinking milk or drinks that contain milk.  2 hours before the procedure - stop drinking clear liquids.  Medicines  Ask your health care provider about: ? Changing or stopping your regular medicines. This is especially important if you are taking diabetes medicines or blood thinners. ? Taking medicines such as aspirin and ibuprofen. These medicines can thin your blood. Do not take these medicines before your procedure if your health care provider instructs you not to. ? Taking new dietary supplements or medicines. Do not take these during the week before your procedure unless your health care provider approves them.  If you are told to take a medicine or to continue taking a medicine on the day of the procedure, take the medicine with sips of water. General instructions   Ask if you will be going home the same day, the following day, or after a longer hospital stay. ? Plan to have someone take you home. ? Plan to  have someone stay with you for the first 24 hours after you leave the hospital or clinic.  For 3-6 weeks before the procedure, try not to use any tobacco products, such as cigarettes, chewing tobacco, and e-cigarettes.  You may brush your teeth on the morning of the procedure, but make sure to spit out the toothpaste. What happens during the procedure?  You will be given anesthetics through a mask and through an IV tube in one of your veins.  You may receive medicine to help you relax (sedative).  As soon as you are asleep, a breathing tube may be used to help you breathe.  An anesthesia specialist will stay with you throughout the procedure. He or she will help keep you comfortable and safe by continuing to give you medicines and adjusting the amount of medicine that you get. He or she will also watch your blood pressure, pulse, and oxygen levels to make sure that the anesthetics do not cause any problems.  If a breathing tube was used to help you breathe, it will be removed before you wake up. The procedure may vary among health care providers and hospitals. What happens after the procedure?  You will wake up, often slowly, after the procedure is complete, usually in a recovery area.  Your blood pressure, heart rate, breathing rate, and blood oxygen level will be monitored until the medicines you were given have worn off.  You may be given medicine to help you calm down if you feel anxious or agitated.  If you will be going home the same day, your health care provider may check to make sure you can stand, drink, and urinate.  Your health care providers will treat your pain and side effects before you go home.  Do not drive for 24 hours if you received a sedative.  You may: ? Feel nauseous and vomit. ? Have a sore throat. ? Have mental slowness. ? Feel cold or shivery. ? Feel sleepy. ? Feel tired. ? Feel sore or achy, even in parts of your body where you did not have  surgery. This information is not intended to replace advice given to you by your health care provider. Make sure you discuss any questions you have with your health care provider. Document Released: 12/19/2007 Document Revised: 02/22/2016 Document Reviewed: 08/26/2015 Elsevier Interactive Patient Education  2018 [place] Anesthesia, Adult, Care After These instructions provide you with information about caring for yourself after your procedure. Your health care provider may also give you more specific instructions. Your treatment has been planned according to current medical practices, but problems sometimes occur. Call your health care provider if you have any problems or questions after your procedure. What can I expect after the procedure? After the procedure, it is common to have:  Vomiting.  A sore throat.  Mental slowness.  It is common to feel:  Nauseous.  Cold or shivery.  Sleepy.  Tired.  Sore or achy, even in parts of your body where you did not have surgery.  Follow these instructions at home: For at least 24 hours after the procedure:  Do not: ? Participate in activities where you could fall or become injured. ? Drive. ? Use heavy machinery. ? Drink alcohol. ? Take sleeping pills or medicines that cause drowsiness. ? Make important decisions or sign legal documents. ? Take care of children on your own.  Rest. Eating and drinking  If you vomit, drink water, juice, or soup when you can drink without vomiting.  Drink enough fluid to keep your urine clear or pale yellow.  Make sure you have little or no nausea before eating solid foods.  Follow the diet recommended by your health care provider. General instructions  Have a responsible adult stay with you until you are awake and alert.  Return to your normal activities as told by your health care provider. Ask your health care provider what activities are safe for you.  Take over-the-counter  and prescription medicines only as told by your health care provider.  If you smoke, do not smoke without supervision.  Keep all follow-up visits as told by your health care provider. This is important. Contact a health care provider if:  You continue to have nausea or vomiting at home, and medicines are not helpful.  You cannot drink fluids or start eating again.  You cannot urinate after 8-12 hours.  You develop a skin rash.  You have fever.  You have increasing redness at the site of your procedure. Get help right away if:  You have difficulty breathing.  You have chest pain.  You have unexpected bleeding.  You feel that you are having a life-threatening or urgent problem. This information is not intended to replace advice given to you by your health care provider. Make sure you discuss any questions you have with your health care provider. Document Released: 12/18/2000 Document Revised: 02/14/2016 Document Reviewed: 08/26/2015 Elsevier Interactive Patient Education  [employer].

## 2017-06-22 ENCOUNTER — Encounter (HOSPITAL_COMMUNITY)
Admission: RE | Admit: 2017-06-22 | Discharge: 2017-06-22 | Disposition: A | Discharge status: Home / Self Care | Payer: Self-pay | Source: Ambulatory Visit | Attending: Orthopedic Surgery | Admitting: Orthopedic Surgery

## 2017-06-22 NOTE — Telephone Encounter (Signed)
Noted  

## 2017-06-25 ENCOUNTER — Telehealth: Payer: Self-pay | Admitting: Orthopedic Surgery

## 2017-06-25 NOTE — Patient Instructions (Signed)
[name] [name]  06/25/2017     @   Your procedure is scheduled on  06/28/2017 .  Report to [facility] at  725  A.M.  Call this number if you have problems the morning of surgery:  [phone]   Remember:  Do not eat food or drink liquids after midnight.  Take these medicines the morning of surgery with A SIP OF WATER  Norvasc, coreg. Use your inhaler before you come.   Do not wear jewelry, make-up or nail polish.  Do not wear lotions, powders, or perfumes, or deoderant.  Do not shave 48 hours prior to surgery.  Men may shave face and neck.  Do not bring valuables to the hospital.  [facility] is not responsible for any belongings or valuables.  Contacts, dentures or bridgework may not be worn into surgery.  Leave your suitcase in the car.  After surgery it may be brought to your room.  For patients admitted to the hospital, discharge time will be determined by your treatment team.  Patients discharged the day of surgery will not be allowed to drive home.   Name and phone number of your driver:   family Special instructions:  None  Please read over the following fact sheets that you were given. Anesthesia Post-op Instructions and Care and Recovery After Surgery      Knee Ligament Injury, Arthroscopy Arthroscopy is a surgical technique in which your health care provider examines your knee through a small, pencil-sized telescope (arthroscope). Often, repairs to injured ligaments can be done with instruments in the arthroscope. Arthroscopy is less invasive than open-knee surgery. Tell a health care provider about:  Any allergies you have.  All medicines you are taking, including vitamins, herbs, eye drops, creams, and over-the-counter medicines.  Any problems you or family members have had with anesthetic medicines.  Any blood disorders you have.  Any surgeries you have had.  Any medical conditions you have. What are the  risks? Generally, this is a safe procedure. However, as with any procedure, problems can occur. Possible problems include:  Infection.  Bleeding.  Stiffness.  What happens before the procedure?  Ask your health care provider about changing or stopping any regular medicines. Avoid taking aspirin or blood thinners as directed by your health care provider.  Do not eat or drink anything after midnight the night before surgery.  If you smoke, do not smoke for at least 2 weeks before your surgery.  Do not drink alcohol starting the day before your surgery.  Let your health care provider know if you develop a cold or any infection before your surgery.  Arrange for someone to drive you home after the surgery or after your hospital stay. Also arrange for someone to help you with activities during recovery. What happens during the procedure?  Small monitors will be put on your body. They are used to check your heart, blood pressure, and oxygen levels.  An IV access tube will be put into one of your veins. Medicine will be able to flow directly into your body through this IV tube.  You might be given a medicine to help you relax (sedative).  You will be given a medicine that makes you go to sleep (general anesthetic), and a breathing tube will be placed into your lungs during the procedure.  Several small incisions are made in your knee. Saline fluid is placed into one of the incisions to  expand the knee and clear away any blood in the knee.  Your health care provider will insert the arthroscope to examine the injured knee.  During arthroscopy, your health care provider may find a partial or complete tear in a ligament.  Tools can be inserted through the other incisions to repair the injured ligaments.  The incisions are then closed with absorbable stitches and covered with dressings. What happens after the procedure?  You will be taken to the recovery area where you will be  monitored.  When you are awake, stable, and taking fluids without problems, you will be allowed to go home. This information is not intended to replace advice given to you by your health care provider. Make sure you discuss any questions you have with your health care provider. Document Released: 09/08/2000 Document Revised: 02/17/2016 Document Reviewed: 04/23/2013 Elsevier Interactive Patient Education  2017 [employer]  Arthroscopic Knee Ligament Repair, Care After This sheet gives you information about how to care for yourself after your procedure. Your health care provider may also give you more specific instructions. If you have problems or questions, contact your health care provider. What can I expect after the procedure? After the procedure, it is common to have:  Pain in your knee.  Bruising and swelling on your knee, calf, and ankle for 3-4 days.  Fatigue.  Follow these instructions at home: If you have a brace or immobilizer:  Wear the brace or immobilizer as told by your health care provider. Remove it only as told by your health care provider.  Loosen the splint or immobilizer if your toes tingle, become numb, or turn cold and blue.  Keep the brace or immobilizer clean. Bathing  Do not take baths, swim, or use a hot tub until your health care provider approves. Ask your health care provider if you can take showers.  Keep your bandage (dressing) dry until your health care provider says that it can be removed. Cover it and your brace or immobilizer with a watertight covering when you take a shower. Incision care  Follow instructions from your health care provider about how to take care of your incision. Make sure you: ? Wash your hands with soap and water before you change your bandage (dressing). If soap and water are not available, use hand sanitizer. ? Change your dressing as told by your health care provider. ? Leave stitches (sutures), skin glue, or adhesive  strips in place. These skin closures may need to stay in place for 2 weeks or longer. If adhesive strip edges start to loosen and curl up, you may trim the loose edges. Do not remove adhesive strips completely unless your health care provider tells you to do that.  Check your incision area every day for signs of infection. Check for: ? More redness, swelling, or pain. ? More fluid or blood. ? Warmth. ? Pus or a bad smell. Managing pain, stiffness, and swelling  If directed, put ice on the affected area. ? If you have a removable brace or immobilizer, remove it as told by your health care provider. ? Put ice in a plastic bag. ? Place a towel between your skin and the bag or between your brace or immobilizer and the bag. ? Leave the ice on for 20 minutes, 2-3 times a day.  Move your toes often to avoid stiffness and to lessen swelling.  Raise (elevate) the injured area above the level of your heart while you are sitting or lying down. Driving  Do not drive until your health care provider approves. If you have a brace or immobilizer on your leg, ask your health care provider when it is safe for you to drive.  Do not drive or use heavy machinery while taking prescription pain medicine. Activity  Rest as directed. Ask your health care provider what activities are safe for you.  Do physical therapy exercises as told by your health care provider. Physical therapy will help you regain strength and motion in your knee.  Follow instructions from your health care provider about: ? When you may start motion exercises. ? When you may start riding a stationary bike and doing other low-impact activities. ? When you may start to jog and do other high-impact activities. Safety  Do not use the injured limb to support your body weight until your health care provider says that you can. Use crutches as told by your health care provider. General instructions  Do not use any products that contain  nicotine or tobacco, such as cigarettes and e-cigarettes. These can delay bone healing. If you need help quitting, ask your health care provider.  To prevent or treat constipation while you are taking prescription pain medicine, your health care provider may recommend that you: ? Drink enough fluid to keep your urine clear or pale yellow. ? Take over-the-counter or prescription medicines. ? Eat foods that are high in fiber, such as fresh fruits and vegetables, whole grains, and beans. ? Limit foods that are high in fat and processed sugars, such as fried and sweet foods.  Take over-the-counter and prescription medicines only as told by your health care provider.  Keep all follow-up visits as told by your health care provider. This is important. Contact a health care provider if:  You have more redness, swelling, or pain around an incision.  You have more fluid or blood coming from an incision.  Your incision feels warm to the touch.  You have a fever.  You have pain or swelling in your knee, and it gets worse.  You have pain that does not get better with medicine. Get help right away if:  You have trouble breathing.  You have pus or a bad smell coming from an incision.  You have numbness and tingling near the knee joint. Summary  After the procedure, it is common to have knee pain with bruising and swelling on your knee, calf, and ankle.  Icing your knee and raising your leg above the level of your heart will help control the pain and the swelling.  Do physical therapy exercises as told by your health care provider. Physical therapy will help you regain strength and motion in your knee. This information is not intended to replace advice given to you by your health care provider. Make sure you discuss any questions you have with your health care provider. Document Released: 07/02/2013 Document Revised: 09/05/2016 Document Reviewed: 09/05/2016 Elsevier Interactive Patient  Education  2017 [place] Anesthesia, Adult General anesthesia is the use of medicines to make a person "go to sleep" (be unconscious) for a medical procedure. General anesthesia is often recommended when a procedure:  Is long.  Requires you to be still or in an unusual position.  Is major and can cause you to lose blood.  Is impossible to do without general anesthesia.  The medicines used for general anesthesia are called general anesthetics. In addition to making you sleep, the medicines:  Prevent pain.  Control your blood pressure.  Relax  your muscles.  Tell a health care provider about:  Any allergies you have.  All medicines you are taking, including vitamins, herbs, eye drops, creams, and over-the-counter medicines.  Any problems you or family members have had with anesthetic medicines.  Types of anesthetics you have had in the past.  Any bleeding disorders you have.  Any surgeries you have had.  Any medical conditions you have.  Any history of heart or lung conditions, such as heart failure, sleep apnea, or chronic obstructive pulmonary disease (COPD).  Whether you are pregnant or may be pregnant.  Whether you use tobacco, alcohol, marijuana, or street drugs.  Any history of Armed forces logistics/support/administrative officer.  Any history of depression or anxiety. What are the risks? Generally, this is a safe procedure. However, problems may occur, including:  Allergic reaction to anesthetics.  Lung and heart problems.  Inhaling food or liquids from your stomach into your lungs (aspiration).  Injury to nerves.  Waking up during your procedure and being unable to move (rare).  Extreme agitation or a state of mental confusion (delirium) when you wake up from the anesthetic.  Air in the bloodstream, which can lead to stroke.  These problems are more likely to develop if you are having a major surgery or if you have an advanced medical condition. You can prevent some of  these complications by answering all of your health care provider's questions thoroughly and by following all pre-procedure instructions. General anesthesia can cause side effects, including:  Nausea or vomiting  A sore throat from the breathing tube.  Feeling cold or shivery.  Feeling tired, washed out, or achy.  Sleepiness or drowsiness.  Confusion or agitation.  What happens before the procedure? Staying hydrated Follow instructions from your health care provider about hydration, which may include:  Up to 2 hours before the procedure - you may continue to drink clear liquids, such as water, clear fruit juice, black coffee, and plain tea.  Eating and drinking restrictions Follow instructions from your health care provider about eating and drinking, which may include:  8 hours before the procedure - stop eating heavy meals or foods such as meat, fried foods, or fatty foods.  6 hours before the procedure - stop eating light meals or foods, such as toast or cereal.  6 hours before the procedure - stop drinking milk or drinks that contain milk.  2 hours before the procedure - stop drinking clear liquids.  Medicines  Ask your health care provider about: ? Changing or stopping your regular medicines. This is especially important if you are taking diabetes medicines or blood thinners. ? Taking medicines such as aspirin and ibuprofen. These medicines can thin your blood. Do not take these medicines before your procedure if your health care provider instructs you not to. ? Taking new dietary supplements or medicines. Do not take these during the week before your procedure unless your health care provider approves them.  If you are told to take a medicine or to continue taking a medicine on the day of the procedure, take the medicine with sips of water. General instructions   Ask if you will be going home the same day, the following day, or after a longer hospital stay. ? Plan to  have someone take you home. ? Plan to have someone stay with you for the first 24 hours after you leave the hospital or clinic.  For 3-6 weeks before the procedure, try not to use any tobacco products, such as cigarettes, chewing  tobacco, and e-cigarettes.  You may brush your teeth on the morning of the procedure, but make sure to spit out the toothpaste. What happens during the procedure?  You will be given anesthetics through a mask and through an IV tube in one of your veins.  You may receive medicine to help you relax (sedative).  As soon as you are asleep, a breathing tube may be used to help you breathe.  An anesthesia specialist will stay with you throughout the procedure. He or she will help keep you comfortable and safe by continuing to give you medicines and adjusting the amount of medicine that you get. He or she will also watch your blood pressure, pulse, and oxygen levels to make sure that the anesthetics do not cause any problems.  If a breathing tube was used to help you breathe, it will be removed before you wake up. The procedure may vary among health care providers and hospitals. What happens after the procedure?  You will wake up, often slowly, after the procedure is complete, usually in a recovery area.  Your blood pressure, heart rate, breathing rate, and blood oxygen level will be monitored until the medicines you were given have worn off.  You may be given medicine to help you calm down if you feel anxious or agitated.  If you will be going home the same day, your health care provider may check to make sure you can stand, drink, and urinate.  Your health care providers will treat your pain and side effects before you go home.  Do not drive for 24 hours if you received a sedative.  You may: ? Feel nauseous and vomit. ? Have a sore throat. ? Have mental slowness. ? Feel cold or shivery. ? Feel sleepy. ? Feel tired. ? Feel sore or achy, even in parts of your  body where you did not have surgery. This information is not intended to replace advice given to you by your health care provider. Make sure you discuss any questions you have with your health care provider. Document Released: 12/19/2007 Document Revised: 02/22/2016 Document Reviewed: 08/26/2015 Elsevier Interactive Patient Education  2018 [place] Anesthesia, Adult, Care After These instructions provide you with information about caring for yourself after your procedure. Your health care provider may also give you more specific instructions. Your treatment has been planned according to current medical practices, but problems sometimes occur. Call your health care provider if you have any problems or questions after your procedure. What can I expect after the procedure? After the procedure, it is common to have:  Vomiting.  A sore throat.  Mental slowness.  It is common to feel:  Nauseous.  Cold or shivery.  Sleepy.  Tired.  Sore or achy, even in parts of your body where you did not have surgery.  Follow these instructions at home: For at least 24 hours after the procedure:  Do not: ? Participate in activities where you could fall or become injured. ? Drive. ? Use heavy machinery. ? Drink alcohol. ? Take sleeping pills or medicines that cause drowsiness. ? Make important decisions or sign legal documents. ? Take care of children on your own.  Rest. Eating and drinking  If you vomit, drink water, juice, or soup when you can drink without vomiting.  Drink enough fluid to keep your urine clear or pale yellow.  Make sure you have little or no nausea before eating solid foods.  Follow the diet recommended by your health  care provider. General instructions  Have a responsible adult stay with you until you are awake and alert.  Return to your normal activities as told by your health care provider. Ask your health care provider what activities are safe for  you.  Take over-the-counter and prescription medicines only as told by your health care provider.  If you smoke, do not smoke without supervision.  Keep all follow-up visits as told by your health care provider. This is important. Contact a health care provider if:  You continue to have nausea or vomiting at home, and medicines are not helpful.  You cannot drink fluids or start eating again.  You cannot urinate after 8-12 hours.  You develop a skin rash.  You have fever.  You have increasing redness at the site of your procedure. Get help right away if:  You have difficulty breathing.  You have chest pain.  You have unexpected bleeding.  You feel that you are having a life-threatening or urgent problem. This information is not intended to replace advice given to you by your health care provider. Make sure you discuss any questions you have with your health care provider. Document Released: 12/18/2000 Document Revised: 02/14/2016 Document Reviewed: 08/26/2015 Elsevier Interactive Patient Education  [employer].

## 2017-06-25 NOTE — Telephone Encounter (Signed)
I spoke to [facility] nurse case manager, [facility] this afternoon.  She wants [place] to cancel pre op and surgery for now on this patient  She did say that the patient is aware of this.  Please cancel all appointments for this patient.  Thanks

## 2017-06-26 ENCOUNTER — Encounter (HOSPITAL_COMMUNITY): Payer: Self-pay

## 2017-06-26 ENCOUNTER — Encounter (HOSPITAL_COMMUNITY)
Admission: RE | Admit: 2017-06-26 | Discharge: 2017-06-26 | Disposition: A | Discharge status: Home / Self Care | Payer: Self-pay | Source: Ambulatory Visit | Attending: Orthopedic Surgery | Admitting: Orthopedic Surgery

## 2017-06-26 NOTE — Telephone Encounter (Signed)
FYI Cancelled surgery for 06/28/17 and pre-op for 06/26/17

## 2017-06-28 ENCOUNTER — Encounter (HOSPITAL_COMMUNITY): Admission: RE | Payer: Self-pay | Source: Ambulatory Visit

## 2017-06-28 ENCOUNTER — Ambulatory Visit (HOSPITAL_COMMUNITY)
Admission: RE | Admit: 2017-06-28 | Payer: Worker's Compensation | Source: Ambulatory Visit | Admitting: Orthopedic Surgery

## 2017-06-28 SURGERY — ARTHROSCOPY, KNEE, WITH MEDIAL MENISCECTOMY
Anesthesia: General | Laterality: Left

## 2017-10-23 DIAGNOSIS — S8990XA Unspecified injury of unspecified lower leg, initial encounter: Secondary | ICD-10-CM | POA: Insufficient documentation

## 2019-04-14 DIAGNOSIS — I1A0 Resistant hypertension: Secondary | ICD-10-CM | POA: Insufficient documentation

## 2019-04-14 DIAGNOSIS — E1169 Type 2 diabetes mellitus with other specified complication: Secondary | ICD-10-CM | POA: Insufficient documentation

## 2019-07-11 ENCOUNTER — Other Ambulatory Visit: Payer: Self-pay

## 2019-07-11 ENCOUNTER — Emergency Department (HOSPITAL_COMMUNITY): Payer: BC Managed Care – PPO

## 2019-07-11 ENCOUNTER — Emergency Department (HOSPITAL_COMMUNITY)
Admission: EM | Admit: 2019-07-11 | Discharge: 2019-07-11 | Disposition: A | Discharge status: Home / Self Care | Payer: BC Managed Care – PPO | Attending: Emergency Medicine | Admitting: Emergency Medicine

## 2019-07-11 ENCOUNTER — Encounter (HOSPITAL_COMMUNITY): Payer: Self-pay | Admitting: *Deleted

## 2019-07-11 DIAGNOSIS — E119 Type 2 diabetes mellitus without complications: Secondary | ICD-10-CM | POA: Diagnosis not present

## 2019-07-11 DIAGNOSIS — R519 Headache, unspecified: Secondary | ICD-10-CM | POA: Diagnosis present

## 2019-07-11 DIAGNOSIS — Z7982 Long term (current) use of aspirin: Secondary | ICD-10-CM | POA: Diagnosis not present

## 2019-07-11 DIAGNOSIS — I10 Essential (primary) hypertension: Secondary | ICD-10-CM | POA: Diagnosis not present

## 2019-07-11 DIAGNOSIS — G44209 Tension-type headache, unspecified, not intractable: Secondary | ICD-10-CM

## 2019-07-11 DIAGNOSIS — Z7984 Long term (current) use of oral hypoglycemic drugs: Secondary | ICD-10-CM | POA: Diagnosis not present

## 2019-07-11 DIAGNOSIS — Z79899 Other long term (current) drug therapy: Secondary | ICD-10-CM | POA: Diagnosis not present

## 2019-07-11 LAB — CBC WITH DIFFERENTIAL/PLATELET
Abs Immature Granulocytes: 0.01 10*3/uL (ref 0.00–0.07)
Basophils Absolute: 0 10*3/uL (ref 0.0–0.1)
Basophils Relative: 0 %
Eosinophils Absolute: 0.2 10*3/uL (ref 0.0–0.5)
Eosinophils Relative: 4 %
HCT: 37.4 % (ref 36.0–46.0)
Hemoglobin: 11.3 g/dL — ABNORMAL LOW (ref 12.0–15.0)
Immature Granulocytes: 0 %
Lymphocytes Relative: 31 %
Lymphs Abs: 1.5 10*3/uL (ref 0.7–4.0)
MCH: 25.7 pg — ABNORMAL LOW (ref 26.0–34.0)
MCHC: 30.2 g/dL (ref 30.0–36.0)
MCV: 85 fL (ref 80.0–100.0)
Monocytes Absolute: 0.4 10*3/uL (ref 0.1–1.0)
Monocytes Relative: 8 %
Neutro Abs: 2.8 10*3/uL (ref 1.7–7.7)
Neutrophils Relative %: 57 %
Platelets: 332 10*3/uL (ref 150–400)
RBC: 4.4 MIL/uL (ref 3.87–5.11)
RDW: 17.4 % — ABNORMAL HIGH (ref 11.5–15.5)
WBC: 4.9 10*3/uL (ref 4.0–10.5)
nRBC: 0 % (ref 0.0–0.2)

## 2019-07-11 LAB — COMPREHENSIVE METABOLIC PANEL
ALT: 19 U/L (ref 0–44)
AST: 17 U/L (ref 15–41)
Albumin: 3.4 g/dL — ABNORMAL LOW (ref 3.5–5.0)
Alkaline Phosphatase: 63 U/L (ref 38–126)
Anion gap: 9 (ref 5–15)
BUN: 17 mg/dL (ref 6–20)
CO2: 26 mmol/L (ref 22–32)
Calcium: 8.8 mg/dL — ABNORMAL LOW (ref 8.9–10.3)
Chloride: 102 mmol/L (ref 98–111)
Creatinine, Ser: 1.08 mg/dL — ABNORMAL HIGH (ref 0.44–1.00)
GFR calc Af Amer: >60 mL/min (ref >60)
GFR calc non Af Amer: 60 mL/min — ABNORMAL LOW (ref >60)
Glucose, Bld: 150 mg/dL — ABNORMAL HIGH (ref 70–99)
Potassium: 3.6 mmol/L (ref 3.5–5.1)
Sodium: 137 mmol/L (ref 135–145)
Total Bilirubin: 0.4 mg/dL (ref 0.3–1.2)
Total Protein: 7.4 g/dL (ref 6.5–8.1)

## 2019-07-11 LAB — CBG MONITORING, ED: Glucose-Capillary: 115 mg/dL — ABNORMAL HIGH (ref 70–99)

## 2019-07-11 MED ORDER — HYDRALAZINE HCL 25 MG PO TABS
25.0000 mg | ORAL_TABLET | Freq: Once | ORAL | Status: AC
  Administered 2019-07-11: 25 mg via ORAL
  Filled 2019-07-11: qty 1

## 2019-07-11 MED ORDER — DIPHENHYDRAMINE HCL 50 MG/ML IJ SOLN
12.5000 mg | Freq: Once | INTRAMUSCULAR | Status: AC
  Administered 2019-07-11: 12.5 mg via INTRAVENOUS
  Filled 2019-07-11: qty 1

## 2019-07-11 MED ORDER — METOCLOPRAMIDE HCL 5 MG/ML IJ SOLN
10.0000 mg | Freq: Once | INTRAMUSCULAR | Status: AC
  Administered 2019-07-11: 10 mg via INTRAVENOUS
  Filled 2019-07-11: qty 2

## 2019-07-11 MED ORDER — KETOROLAC TROMETHAMINE 30 MG/ML IJ SOLN
30.0000 mg | Freq: Once | INTRAMUSCULAR | Status: AC
  Administered 2019-07-11: 30 mg via INTRAVENOUS
  Filled 2019-07-11: qty 1

## 2019-07-11 MED ORDER — CLONIDINE HCL 0.1 MG PO TABS
0.1000 mg | ORAL_TABLET | Freq: Once | ORAL | Status: AC
  Administered 2019-07-11: 0.1 mg via ORAL
  Filled 2019-07-11: qty 1

## 2019-07-11 NOTE — ED Triage Notes (Signed)
Patient comes to the ED with complaints of a headache, hypertension and general body aches.  Patient states she has history of hypertension and has already taken her blood pressure medication today.

## 2019-07-11 NOTE — ED Provider Notes (Signed)
[facility] EMERGENCY DEPARTMENT Provider Note   CSN: [id number] Arrival date & time: 07/11/19  1111     History   Chief Complaint Chief Complaint  Patient presents with  . Headache    HPI [name] [name] is a 53 y.o. female.     The history is provided by the patient. No language interpreter was used.  Headache Pain location:  Generalized Quality:  Sharp Radiates to:  Eyes Onset quality:  Gradual Duration:  1 day Timing:  Constant Progression:  Worsening Chronicity:  New Similar to prior headaches: yes   Relieved by:  Nothing Worsened by:  Nothing Associated symptoms: no focal weakness   Risk factors: no anger   t complains of high blood pressure and a headache today behind right eye. Pt has had similar headaches in the past.   Past Medical History:  Diagnosis Date  . Diabetes mellitus without complication ([place])   . Hypertension     There are no active problems to display for this patient.   Past Surgical History:  Procedure Laterality Date  . CESAREAN SECTION  2001  . RHINOPLASTY       OB History   No obstetric history on file.      Home Medications    Prior to Admission medications   Medication Sig Start Date End Date Taking? Authorizing Provider  acetaminophen (TYLENOL) 500 MG tablet Take 500 mg by mouth every 6 (six) hours as needed.    Yes [provider]  albuterol (PROVENTIL HFA;VENTOLIN HFA) 108 (90 Base) MCG/ACT inhaler Inhale 1 puff into the lungs every 4 (four) hours as needed for wheezing or shortness of breath.   Yes [provider]  aspirin EC 81 MG tablet Take 81 mg by mouth daily.   Yes [provider]  carvedilol (COREG) 25 MG tablet Take 25 mg by mouth 2 (two) times daily with a meal.   Yes [provider]  cloNIDine (CATAPRES - DOSED IN MG/24 HR) 0.2 mg/24hr patch Place 1 patch onto the skin once a week. Mondays 10/05/15  Yes [provider]  cloNIDine (CATAPRES) 0.1 MG tablet Take 0.1  mg by mouth daily as needed (for elevated blood pressure).   Yes [provider]  fluticasone (FLONASE) 50 MCG/ACT nasal spray Place 2 sprays into both nostrils daily as needed.   Yes [provider]  furosemide (LASIX) 20 MG tablet Take 1 tablet by mouth daily. 04/14/19  Yes [provider]  glimepiride (AMARYL) 2 MG tablet Take 1 tablet by mouth 2 (two) times daily. 05/04/19  Yes [provider]  hydrALAZINE (APRESOLINE) 50 MG tablet Take 1 tablet by mouth 3 (three) times daily. 05/04/19  Yes [provider]  metFORMIN (GLUCOPHAGE) 500 MG tablet Take 500 mg by mouth 2 (two) times daily with a meal.   Yes [provider]  Multiple Vitamins-Minerals (MULTIVITAMIN WITH MINERALS) tablet Take 1 tablet by mouth daily.   Yes [provider]  PENNSAID 2 % SOLN Apply 2 application topically daily as needed. 11/11/15  Yes [provider]    Family History Family History  Problem Relation Age of Onset  . Diabetes Mother   . Kidney disease Mother   . Diabetes Father   . Heart disease Father     Social History Social History   Tobacco Use  . Smoking status: Never Smoker  . Smokeless tobacco: Never Used  Substance Use Topics  . Alcohol use: No  . Drug use: No  Allergies   Ace inhibitors, Cefoxitin, and Ceftin [cefuroxime axetil]   Review of Systems Review of Systems  Neurological: Positive for headaches. Negative for focal weakness.  All other systems reviewed and are negative.    Physical Exam Updated Vital Signs BP (!) 185/94   Pulse 76   Temp 98.2 F (36.8 C) (Oral)   Resp 20   Ht 5\' 3"  (1.6 m)   Wt 93.4 kg   LMP 06/16/2019   SpO2 98%   BMI 36.49 kg/m   Physical Exam Vitals signs and nursing note reviewed.  Constitutional:      Appearance: She is well-developed.  HENT:     Head: Normocephalic.  Eyes:     Extraocular Movements: Extraocular movements intact.     Pupils: Pupils are equal, round,  and reactive to light.  Neck:     Musculoskeletal: Normal range of motion.  Cardiovascular:     Rate and Rhythm: Normal rate.     Heart sounds: Normal heart sounds.  Pulmonary:     Effort: Pulmonary effort is normal.  Abdominal:     General: There is no distension.  Musculoskeletal: Normal range of motion.  Skin:    General: Skin is warm.  Neurological:     Mental Status: She is alert and oriented to person, place, and time.  Psychiatric:        Mood and Affect: Mood normal.      ED Treatments / Results  Labs (all labs ordered are listed, but only abnormal results are displayed) Labs Reviewed  CBC WITH DIFFERENTIAL/PLATELET - Abnormal; Notable for the following components:      Result Value   Hemoglobin 11.3 (*)    MCH 25.7 (*)    RDW 17.4 (*)    All other components within normal limits  COMPREHENSIVE METABOLIC PANEL - Abnormal; Notable for the following components:   Glucose, Bld 150 (*)    Creatinine, Ser 1.08 (*)    Calcium 8.8 (*)    Albumin 3.4 (*)    GFR calc non Af Amer 60 (*)    All other components within normal limits  CBG MONITORING, ED - Abnormal; Notable for the following components:   Glucose-Capillary 115 (*)    All other components within normal limits    EKG EKG Interpretation  Date/Time:  Friday July 11 2019 12:34:00 EDT Ventricular Rate:  77 PR Interval:    QRS Duration: 97 QT Interval:  415 QTC Calculation: 470 R Axis:   52 Text Interpretation:  Sinus rhythm No significant change since last tracing Confirmed by Cathren LaineSteinl, Kevin ([id number]) on 07/11/2019 12:44:17 PM   Radiology Ct Head Wo Contrast  Result Date: 07/11/2019 CLINICAL DATA:  Headache, hypertension, general body aches EXAM: CT HEAD WITHOUT CONTRAST TECHNIQUE: Contiguous axial images were obtained from the base of the skull through the vertex without intravenous contrast. COMPARISON:  10/01/2014 FINDINGS: Brain: No evidence of acute infarction, hemorrhage, hydrocephalus,  extra-axial collection or mass lesion/mass effect. Vascular: No hyperdense vessel or unexpected calcification. Skull: Normal. Negative for fracture or focal lesion. Sinuses/Orbits: No acute finding. Other: None. IMPRESSION: No acute intracranial pathology. No non-contrast CT findings to explain headache. Electronically Signed   By: Lauralyn PrimesAlex  Bibbey M.D.   On: 07/11/2019 13:19    Procedures Procedures (including critical care time)  Medications Ordered in ED Medications  hydrALAZINE (APRESOLINE) tablet 25 mg (25 mg Oral Given 07/11/19 1238)  ketorolac (TORADOL) 30 MG/ML injection 30 mg (30 mg Intravenous Given 07/11/19 1411)  metoCLOPramide (REGLAN)  injection 10 mg (10 mg Intravenous Given 07/11/19 1411)  diphenhydrAMINE (BENADRYL) injection 12.5 mg (12.5 mg Intravenous Given 07/11/19 1411)     Initial Impression / Assessment and Plan / ED Course  I have reviewed the triage vital signs and the nursing notes.  Pertinent labs & imaging results that were available during my care of the patient were reviewed by me and considered in my medical decision making (see chart for details).        Pt reports headache resolved after medications.  Pt is on multiple blood pressure medications.  Pt advised she needs to see her MD for recheck. Continue current medications.    Final Clinical Impressions(s) / ED Diagnoses   Final diagnoses:  Hypertension, unspecified type  Acute non intractable tension-type headache    ED Discharge Orders    None     An After Visit Summary was printed and given to the patient.   Elson Areas, [place] 07/11/19 1635    Cathren Laine, MD 07/14/19 1426

## 2019-07-11 NOTE — Discharge Instructions (Addendum)
See your Physician for recheck.  °

## 2019-10-21 ENCOUNTER — Ambulatory Visit: Payer: BC Managed Care – PPO | Attending: Internal Medicine

## 2019-10-21 ENCOUNTER — Other Ambulatory Visit: Payer: Self-pay

## 2019-10-21 DIAGNOSIS — Z20822 Contact with and (suspected) exposure to covid-19: Secondary | ICD-10-CM

## 2019-10-22 LAB — NOVEL CORONAVIRUS, NAA: SARS-CoV-2, NAA: NOT DETECTED

## 2020-09-14 ENCOUNTER — Other Ambulatory Visit (HOSPITAL_COMMUNITY): Payer: Self-pay | Admitting: Podiatry

## 2020-09-14 DIAGNOSIS — M66872 Spontaneous rupture of other tendons, left ankle and foot: Secondary | ICD-10-CM

## 2020-09-29 ENCOUNTER — Ambulatory Visit (HOSPITAL_COMMUNITY)
Admission: RE | Admit: 2020-09-29 | Discharge: 2020-09-29 | Disposition: A | Discharge status: Home / Self Care | Payer: BC Managed Care – PPO | Source: Ambulatory Visit | Attending: Podiatry | Admitting: Podiatry

## 2020-09-29 ENCOUNTER — Other Ambulatory Visit: Payer: Self-pay

## 2020-09-29 DIAGNOSIS — M66872 Spontaneous rupture of other tendons, left ankle and foot: Secondary | ICD-10-CM

## 2021-08-12 ENCOUNTER — Encounter: Payer: Self-pay | Admitting: Cardiology

## 2021-08-12 DIAGNOSIS — N182 Chronic kidney disease, stage 2 (mild): Secondary | ICD-10-CM | POA: Insufficient documentation

## 2021-08-12 DIAGNOSIS — R079 Chest pain, unspecified: Secondary | ICD-10-CM | POA: Insufficient documentation

## 2021-08-12 DIAGNOSIS — I161 Hypertensive emergency: Secondary | ICD-10-CM | POA: Insufficient documentation

## 2021-08-12 DIAGNOSIS — R519 Headache, unspecified: Secondary | ICD-10-CM | POA: Insufficient documentation

## 2021-08-13 DIAGNOSIS — E876 Hypokalemia: Secondary | ICD-10-CM | POA: Insufficient documentation

## 2021-10-31 ENCOUNTER — Encounter: Payer: Self-pay | Admitting: *Deleted

## 2021-11-01 ENCOUNTER — Other Ambulatory Visit: Payer: Self-pay

## 2021-11-01 ENCOUNTER — Encounter: Payer: Self-pay | Admitting: Cardiology

## 2021-11-01 ENCOUNTER — Ambulatory Visit: Payer: BC Managed Care – PPO | Admitting: Cardiology

## 2021-11-01 VITALS — BP 172/100 | HR 92 | Ht 63.0 in | Wt 202.8 lb

## 2021-11-01 DIAGNOSIS — I10 Essential (primary) hypertension: Secondary | ICD-10-CM

## 2021-11-01 DIAGNOSIS — I110 Hypertensive heart disease with heart failure: Secondary | ICD-10-CM | POA: Diagnosis not present

## 2021-11-01 DIAGNOSIS — R0602 Shortness of breath: Secondary | ICD-10-CM | POA: Diagnosis not present

## 2021-11-01 DIAGNOSIS — G4730 Sleep apnea, unspecified: Secondary | ICD-10-CM

## 2021-11-01 MED ORDER — HYDRALAZINE HCL 100 MG PO TABS: 100.0000 mg | ORAL_TABLET | Freq: Three times a day (TID) | ORAL | 6 refills | Status: DC

## 2021-11-01 MED ORDER — FUROSEMIDE 40 MG PO TABS: 40.0000 mg | ORAL_TABLET | Freq: Every day | ORAL | 6 refills | Status: DC

## 2021-11-01 NOTE — Progress Notes (Signed)
Clinical Summary [name] [name] is a 56 y.o.female seen today as a new patient for the following medical problems.    Previosuly followed at [facility] cardiology, last clinic note 2015   Chest pain - cath at [facility] without significant disease - no recent chest pains.     2. LVH secondary to hypertensive heart disease - mentioned in [facility] notes    3. Leg edema - ongoing edema in legs, she is on lasix 20mg  daily.     4.HTN - 07/2021 admission [facility] SBP over 200 - difficult to control bp during admission  - currently on coreg 25mg  bid, clonidine patch 0.2mg , hydralzine 50mg  tid, imdur 90mg  daily. Already on lasix 20mg  daily.  - ACE-I swellign and SOB   08/2021 notes "Earlier today, patient had renal duplex performed, which demonstrated no evidence of critical renal artery stenosis" - metaneprhine/ normetaneprhines levels were normal    Sleep apnea screen: +snoring, +daytime somnolence     Past Medical History:  Diagnosis Date   Diabetes mellitus without complication (H[facility])    Hypertension      Allergies  Allergen Reactions   Ace Inhibitors Swelling and Shortness Of Breath   Cefoxitin Shortness Of Breath   Ceftin [Cefuroxime Axetil] Anaphylaxis     Current Outpatient Medications  Medication Sig Dispense Refill   acetaminophen (TYLENOL) 500 MG tablet Take 500 mg by mouth every 6 (six) hours as needed.      albuterol (PROVENTIL HFA;VENTOLIN HFA) 108 (90 Base) MCG/ACT inhaler Inhale 1 puff into the lungs every 4 (four) hours as needed for wheezing or shortness of breath.     aspirin EC 81 MG tablet Take 81 mg by mouth daily.     carvedilol (COREG) 25 MG tablet Take 25 mg by mouth 2 (two) times daily with a meal.     cloNIDine (CATAPRES - DOSED IN MG/24 HR) 0.2 mg/24hr patch Place 1 patch onto the skin once a week. Mondays  12   cloNIDine (CATAPRES) 0.1 MG tablet Take 0.1 mg by mouth daily as needed (for elevated blood pressure).     fluticasone  (FLONASE) 50 MCG/ACT nasal spray Place 2 sprays into both nostrils daily as needed.     furosemide (LASIX) 20 MG tablet Take 1 tablet by mouth daily.     glimepiride (AMARYL) 2 MG tablet Take 1 tablet by mouth 2 (two) times daily.     hydrALAZINE (APRESOLINE) 50 MG tablet Take 1 tablet by mouth 3 (three) times daily.     metFORMIN (GLUCOPHAGE) 500 MG tablet Take 500 mg by mouth 2 (two) times daily with a meal.     Multiple Vitamins-Minerals (MULTIVITAMIN WITH MINERALS) tablet Take 1 tablet by mouth daily.     PENNSAID 2 % SOLN Apply 2 application topically daily as needed.  0   No current facility-administered medications for this visit.     Past Surgical History:  Procedure Laterality Date   CESAREAN SECTION  2001   RHINOPLASTY       Allergies  Allergen Reactions   Ace Inhibitors Swelling and Shortness Of Breath   Cefoxitin Shortness Of Breath   Ceftin [Cefuroxime Axetil] Anaphylaxis      Family History  Problem Relation Age of Onset   Diabetes Mother    Kidney disease Mother    Diabetes Father    Heart disease Father      Social History Ms. Sayler reports that she has never smoked. She has never used smokeless  tobacco. Ms. Doan reports no history of alcohol use.   Review of Systems CONSTITUTIONAL: No weight loss, fever, chills, weakness or fatigue.  HEENT: Eyes: No visual loss, blurred vision, double vision or yellow sclerae.No hearing loss, sneezing, congestion, runny nose or sore throat.  SKIN: No rash or itching.  CARDIOVASCULAR: per hpi RESPIRATORY: No shortness of breath, cough or sputum.  GASTROINTESTINAL: No anorexia, nausea, vomiting or diarrhea. No abdominal pain or blood.  GENITOURINARY: No burning on urination, no polyuria NEUROLOGICAL: No headache, dizziness, syncope, paralysis, ataxia, numbness or tingling in the extremities. No change in bowel or bladder control.  MUSCULOSKELETAL: No muscle, back pain, joint pain or stiffness.  LYMPHATICS: No  enlarged nodes. No history of splenectomy.  PSYCHIATRIC: No history of depression or anxiety.  ENDOCRINOLOGIC: No reports of sweating, cold or heat intolerance. No polyuria or polydipsia.  Marland Kitchen   Physical Examination Today's Vitals   11/01/21 1314  BP: (!) 172/100  Pulse: 92  SpO2: 95%  Weight: 202 lb 12.8 oz (92 kg)  Height: 5\' 3"  (1.6 m)   Body mass index is 35.92 kg/m.  Gen: resting comfortably, no acute distress HEENT: no scleral icterus, pupils equal round and reactive, no palptable cervical adenopathy,  CV: RRR, no m/r/ gno jvd Resp: Clear to auscultation bilaterally GI: abdomen is soft, non-tender, non-distended, normal bowel sounds, no hepatosplenomegaly MSK: extremities are warm, 1+ bilateral LE edema.  Skin: warm, no rash Neuro:  no focal deficits Psych: appropriate affect   Diagnostic Studies  2015 [facility] cath Diagnostic findings:   Left coronary artery:  Left main is angiographically normal  Left anterior descending is a large vessel. There is a very large diagonal one vessel. The left anterior descending has scattered mild irregularities roughly 10% in severity  Left circumflex artery is a large vessel. There is a moderate-sized ramus vessel. The left circumflex system has mild irregularities roughly 10% in severity   Right coronary artery:  Right coronary artery is a dominant artery. There is no significant stenosis noted in the right coronary artery however there is mild ectasia noted in the mid portion of the vessel    Aortic pressure 146/71  Left ventricular ejection fraction calculated at 58% by nuclear stress test     Assessment and Plan  1.HTN - severe HTN, long history of which the management is somewhat unclear - appears had normal renal artery 2016 at [facility] in 2020 - we will order aldo/renin ratio, refer to pulmonary for OSA evaluation as she has other signs and symptoms - increase hydralazine to 100mg  tid - ACEi allergy, im unclear if she has  been tried on ARB. Unclear if tried on CCB in the past. Had been on chlorthalidone now on lasix, Im assuming due to LE edema.  - likely would add aldactone as next agent once labs are back  2.Hypertensive heart disease with HF - ongoing LE edema - increase lasix to 40mg  daily - check echo  EKG shows SR,   F/u 2 months   2021, M.D.

## 2021-11-01 NOTE — Patient Instructions (Addendum)
Medication Instructions:  Increase Hydralazine to 100mg  three times per day  Increase Lasix to 40mg  daily  Continue all other medications.     Labwork: BMET, Mg, Aldosterone & Renin Ratio, BNP - orders given today Please do labs in 1 weeks (around 11/08/2021)  Testing/Procedures: Your physician has requested that you have an echocardiogram. Echocardiography is a painless test that uses sound waves to create images of your heart. It provides your doctor with information about the size and shape of your heart and how well your hearts chambers and valves are working. This procedure takes approximately one hour. There are no restrictions for this procedure.   Follow-Up: Office will contact with results via phone or letter.    2 months   Any Other Special Instructions Will Be Listed Below (If Applicable). You have been referred to:  Pulmonology   If you need a refill on your cardiac medications before your next appointment, please call your pharmacy

## 2021-11-09 IMAGING — MR MR ANKLE*L* W/O CM
4 of 5 series · 31 of 40 positions shown · non-contrast
Comparison: MRI left ankle dated June 10, 2015. Left ankle
x-rays dated October 22, 2012.

CLINICAL DATA: Left ankle pain and swelling for the past 3 months.

EXAM:
MRI OF THE LEFT ANKLE WITHOUT CONTRAST
TECHNIQUE: Multiplanar, multisequence MR imaging of the ankle was performed. No
intravenous contrast was administered.

[Series 4: PD fat-sat · axial · left · 3.0mm · 0.35mm/px · z∈[-86,+10]mm · 8 of 25 slices shown]
[im 1/25]
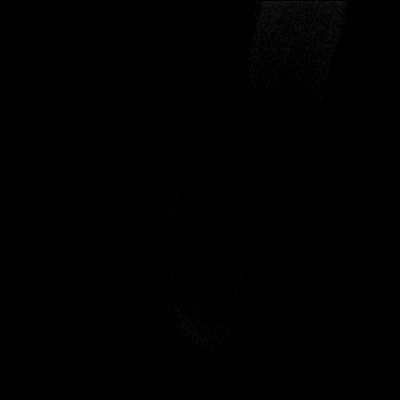
[im 4/25]
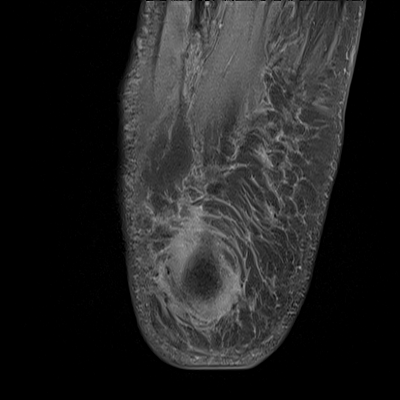
[im 7/25]
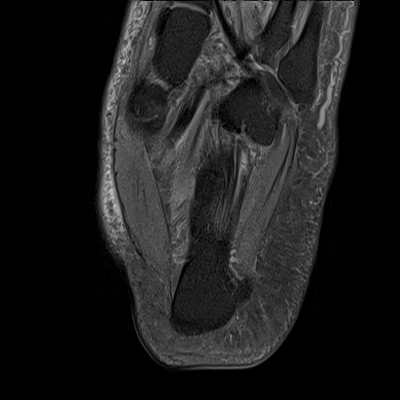
[im 11/25]
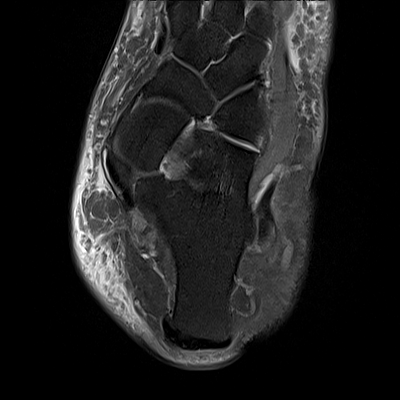
[im 14/25]
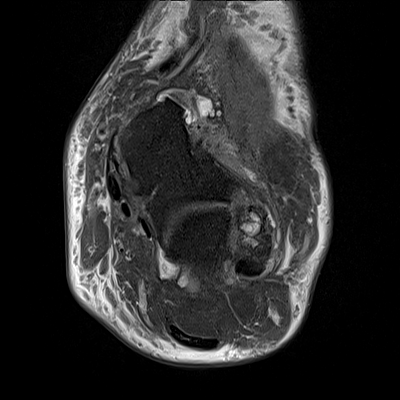
[im 18/25]
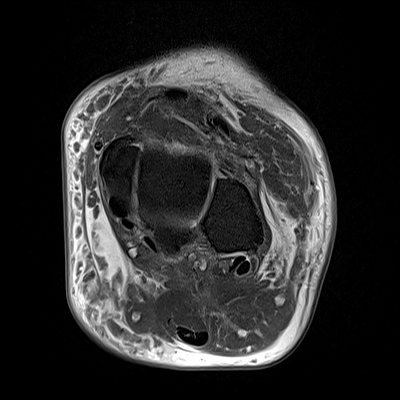
[im 21/25]
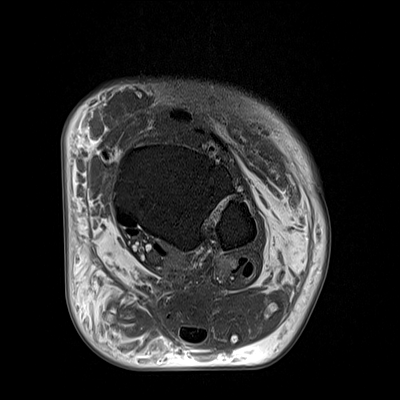
[im 25/25]
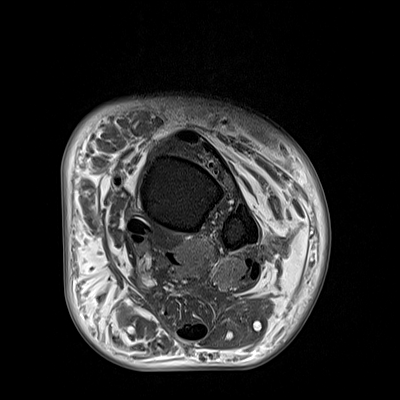

[Series 5: T2 fat-sat · axial · left · 3.0mm · 0.36mm/px · z∈[-86,+10]mm · 8 of 25 slices shown (1 of 2)]
[im 1/25]
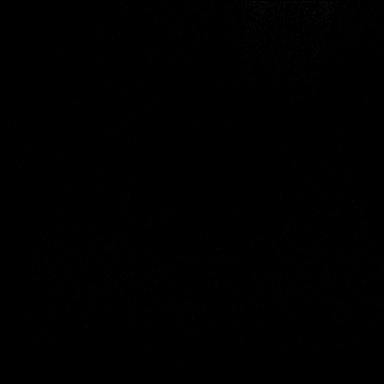
[im 4/25]
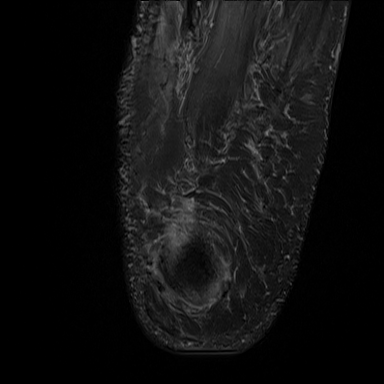
[im 7/25]
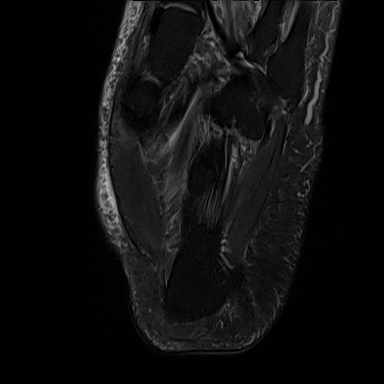
[im 11/25]
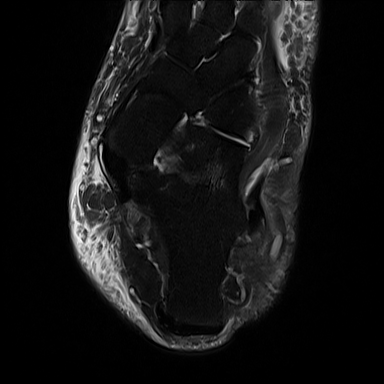
[im 14/25]
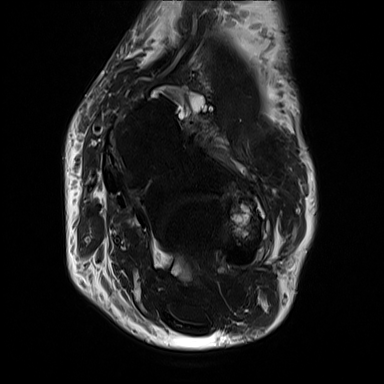
[im 18/25]
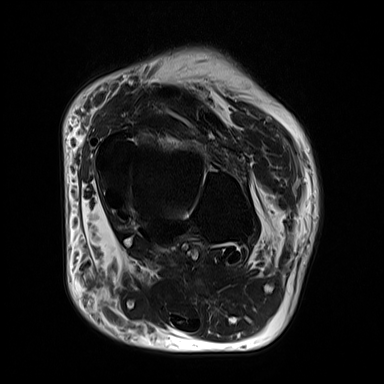
[im 21/25]
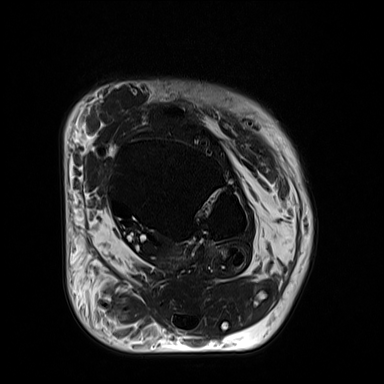
[im 25/25]
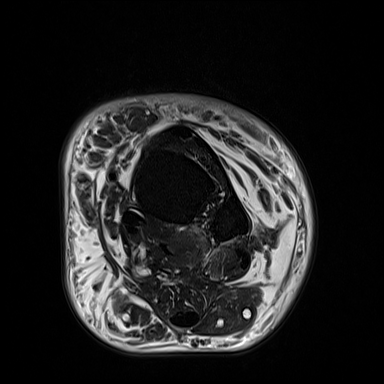

[Series 6: T2 fat-sat · coronal · left · 3.0mm · 0.36mm/px · 8 of 25 slices shown (2 of 2)]
[im 1/25]
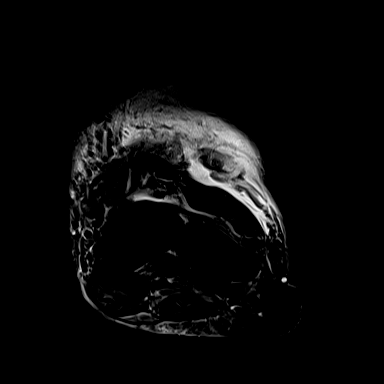
[im 4/25]
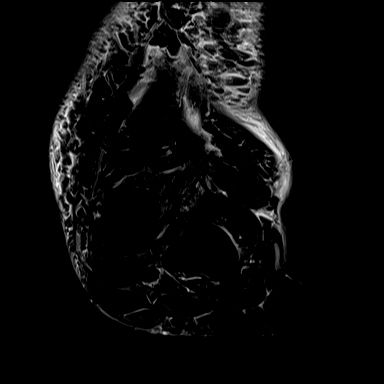
[im 7/25]
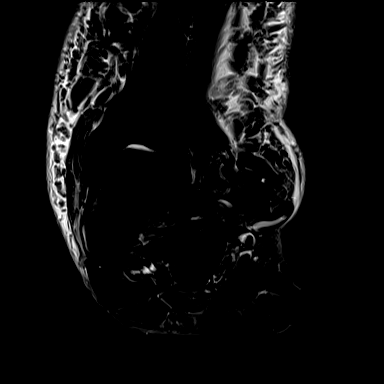
[im 11/25]
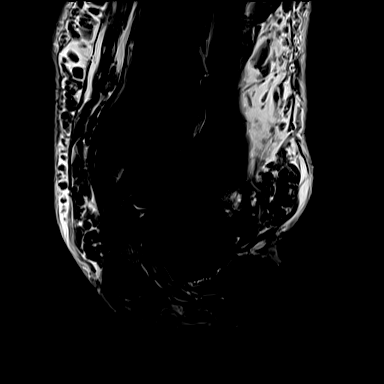
[im 14/25]
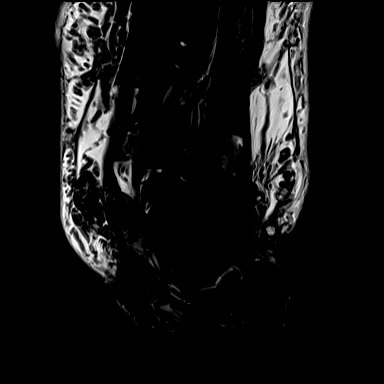
[im 18/25]
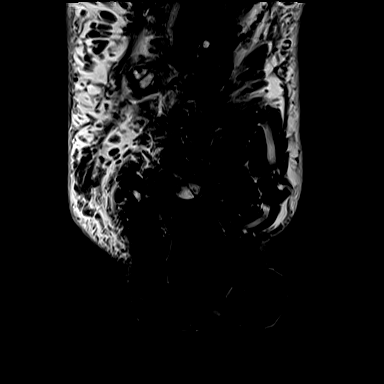
[im 21/25]
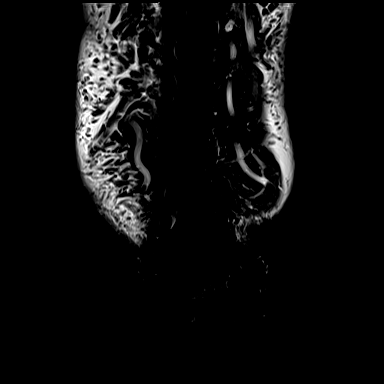
[im 25/25]
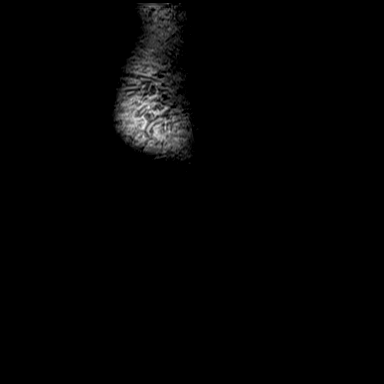

[Series 7: T1 · sagittal · left · 4.0mm · 0.35mm/px · 7 of 23 slices shown]
[im 1/23]
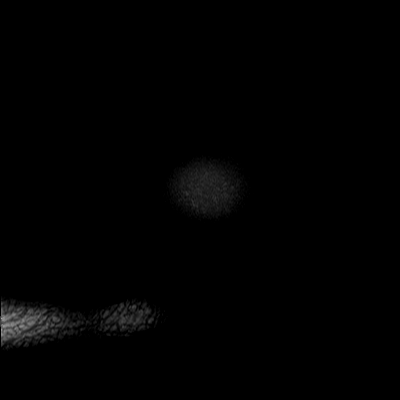
[im 4/23]
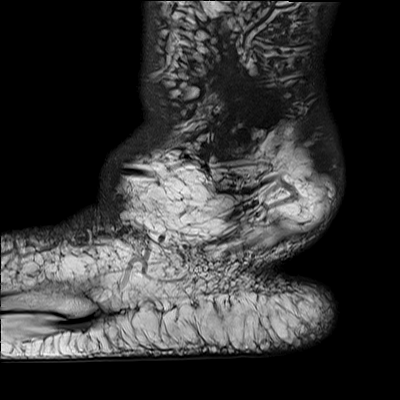
[im 7/23]
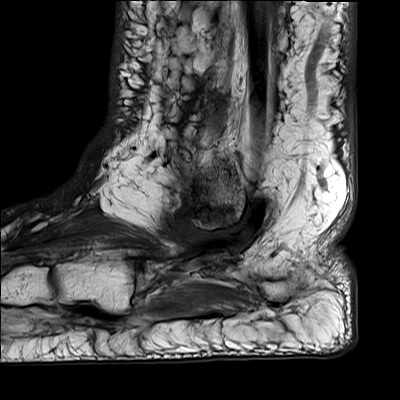
[im 10/23]
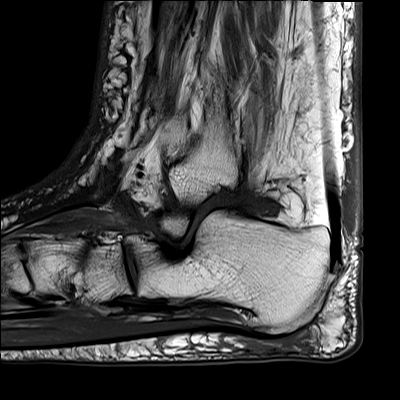
[im 13/23]
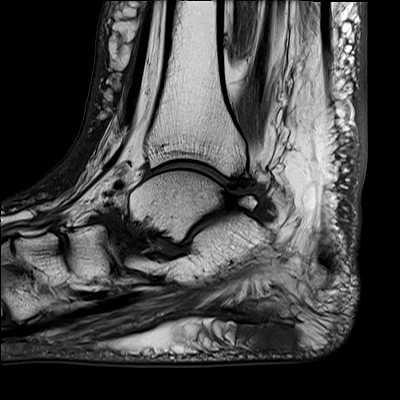
[im 16/23]
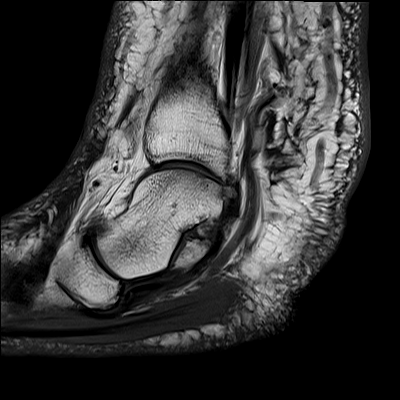
[im 19/23]
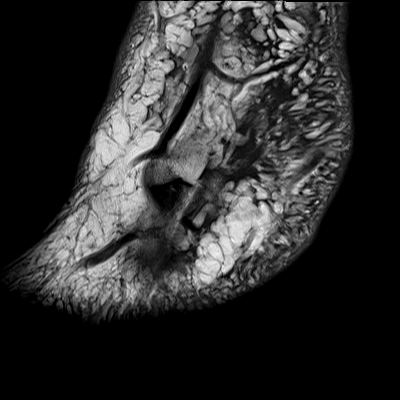

[31 of 40 positions shown; findings below may reference images not displayed]

FINDINGS: TENDONS

Peroneal: Peroneal longus tendon intact. Peroneal brevis intact.

Posteromedial: Posterior tibial tendon intact with mild tendinosis.
Flexor digitorum longus tendon intact. Flexor hallucis longus tendon
intact.

Anterior: Tibialis anterior tendon intact. Extensor hallucis longus
tendon intact Extensor digitorum longus tendon intact.

Achilles:  Intact.

Plantar Fascia: Intact.

LIGAMENTS

Lateral: Anterior talofibular ligament intact. Calcaneofibular
ligament intact. Posterior talofibular ligament intact. Anterior and
posterior tibiofibular ligaments intact.

Medial: Deltoid ligament intact. Spring ligament intact.

CARTILAGE

Ankle Joint: No joint effusion. Normal ankle mortise. No chondral
defect.

Subtalar Joints/Sinus Tarsi: Normal subtalar joints. Small posterior
subtalar joint effusion with synovitis. New loss of the normal fat
within the sinus tarsi.

Bones: Progressive severe pes planovalgus with worsening subcortical
cystic change in the distal fibula. New faint marrow edema in the
lateral calcaneus adjacent to the lateral talar process. No acute
fracture or dislocation. No suspicious bone lesion. Os trigonum
again noted. Small talonavicular joint effusion.

Soft Tissue: Severe diffuse soft tissue swelling, progressed since
the prior study. No soft tissue mass or fluid collection.
IMPRESSION: 1. Progressive severe pes planovalgus with lateral hindfoot
impingement.
2. Mild posterior tibial tendinosis.  No tear.
3. New loss of the normal fat within the sinus tarsi. Correlate for
sinus tarsi syndrome.
4. Severe diffuse soft tissue swelling, progressed since the prior
study, nonspecific.

## 2021-11-23 ENCOUNTER — Other Ambulatory Visit (HOSPITAL_COMMUNITY)
Admission: RE | Admit: 2021-11-23 | Discharge: 2021-11-23 | Disposition: A | Discharge status: Home / Self Care | Payer: BC Managed Care – PPO | Source: Ambulatory Visit | Attending: Cardiology | Admitting: Cardiology

## 2021-11-23 DIAGNOSIS — I10 Essential (primary) hypertension: Secondary | ICD-10-CM | POA: Diagnosis not present

## 2021-11-23 DIAGNOSIS — R0602 Shortness of breath: Secondary | ICD-10-CM | POA: Insufficient documentation

## 2021-11-23 DIAGNOSIS — I110 Hypertensive heart disease with heart failure: Secondary | ICD-10-CM | POA: Diagnosis present

## 2021-11-23 LAB — BASIC METABOLIC PANEL
Anion gap: 8 (ref 5–15)
BUN: 29 mg/dL — ABNORMAL HIGH (ref 6–20)
CO2: 26 mmol/L (ref 22–32)
Calcium: 9 mg/dL (ref 8.9–10.3)
Chloride: 102 mmol/L (ref 98–111)
Creatinine, Ser: 1.3 mg/dL — ABNORMAL HIGH (ref 0.44–1.00)
GFR, Estimated: 49 mL/min — ABNORMAL LOW (ref >60)
Glucose, Bld: 112 mg/dL — ABNORMAL HIGH (ref 70–99)
Potassium: 3.8 mmol/L (ref 3.5–5.1)
Sodium: 136 mmol/L (ref 135–145)

## 2021-11-23 LAB — BRAIN NATRIURETIC PEPTIDE: B Natriuretic Peptide: 75 pg/mL (ref 0.0–100.0)

## 2021-11-23 LAB — MAGNESIUM: Magnesium: 1.9 mg/dL (ref 1.7–2.4)

## 2021-12-01 ENCOUNTER — Other Ambulatory Visit: Payer: BC Managed Care – PPO

## 2021-12-02 LAB — ALDOSTERONE + RENIN ACTIVITY W/ RATIO
ALDO / PRA Ratio: 62.1 — ABNORMAL HIGH (ref 0.0–30.0)
Aldosterone: 11.8 ng/dL (ref 0.0–30.0)
PRA LC/MS/MS: 0.19 ng/mL/hr (ref 0.167–5.380)

## 2021-12-06 ENCOUNTER — Other Ambulatory Visit: Payer: Self-pay

## 2021-12-06 ENCOUNTER — Ambulatory Visit (INDEPENDENT_AMBULATORY_CARE_PROVIDER_SITE_OTHER): Payer: BC Managed Care – PPO

## 2021-12-06 DIAGNOSIS — R0602 Shortness of breath: Secondary | ICD-10-CM | POA: Diagnosis not present

## 2021-12-06 LAB — ECHOCARDIOGRAM COMPLETE
AV Mean grad: 4.5 mmHg
AV Peak grad: 9.6 mmHg
Ao pk vel: 1.55 m/s
Area-P 1/2: 4.63 cm2
S' Lateral: 3.36 cm

## 2021-12-09 ENCOUNTER — Telehealth: Payer: Self-pay | Admitting: *Deleted

## 2021-12-09 MED ORDER — FUROSEMIDE 40 MG PO TABS: ORAL_TABLET | ORAL | Status: AC

## 2021-12-09 NOTE — Telephone Encounter (Signed)
Lesle Chris, LPN  ?0/73/7106  1:14 PM EDT Back to Top  ?  ?Notified, copy to pcp.   ? Lesle Chris, LPN  ?2/69/4854 10:33 AM EDT   ?  ?Left message to return call.  ? Antoine Poche, MD  ?12/04/2021 11:32 AM EDT   ?  ?Slight decrease in kidney function, may be due to recent increase in lasix. Would clarify if taking lasix 40mg  daily, if so change to 40mg  alternating days with 20mg   ?  ? MD  ? ?

## 2021-12-30 ENCOUNTER — Telehealth: Payer: Self-pay | Admitting: *Deleted

## 2021-12-30 NOTE — Telephone Encounter (Signed)
Lesle Chris, LPN  ?09/30/6061  4:03 PM EDT Back to Top  ?  ?Notified, copy to pcp.   ? Lesle Chris, LPN  ?0/16/0109  6:01 PM EDT   ?  ?Left message to return call.  ? Antoine Poche, MD  ?12/14/2021  4:01 PM EDT   ?  ?Echo overall looks good. Mild thickening and stiffness of the heart muscle that is blood pressure related and considred minor finding. Overall echo without significant abnormal findings. ?  ?  ?Dominga Ferry MD  ? ?

## 2022-01-12 ENCOUNTER — Other Ambulatory Visit (HOSPITAL_COMMUNITY): Payer: Self-pay | Admitting: Orthopedic Surgery

## 2022-01-18 NOTE — Progress Notes (Signed)
Chart reviewed with Dr. Tacy Dura, no further cards testing or clearance needed before her scheduled procedure at [facility]. ?

## 2022-01-19 ENCOUNTER — Telehealth: Payer: Self-pay | Admitting: Cardiology

## 2022-01-19 ENCOUNTER — Ambulatory Visit: Payer: BC Managed Care – PPO | Admitting: Cardiology

## 2022-01-19 ENCOUNTER — Encounter (HOSPITAL_COMMUNITY): Payer: Self-pay | Admitting: Anesthesiology

## 2022-01-19 NOTE — Telephone Encounter (Signed)
Left message for the pt to call back for tele pre op appt.  ?

## 2022-01-19 NOTE — Progress Notes (Signed)
Patient saw Dr. Wyline Mood, cardiologist, on 11/01/21 and per Dr. Wyline Mood was to follow up in 2 months d/t uncontrolled BP, fluid retention, and medication changes. Patient had cardiology appointment scheduled for today (01/19/22) but cancelled. Chart reviewed with Dr. Donavan Foil, anesthesiologist. Per Dr. Donavan Foil, patient must follow up with cardiology prior to sx at [facility]. [place], at Dr. Laverta [place] office notified of this and and cardiac clearance requested as well. ?

## 2022-01-19 NOTE — Telephone Encounter (Signed)
? ? ?  Name: [name] [name]  ?DOB: 02/01/1966  ?MRN: [id number] ? ?Primary Cardiologist: None ? ? ?Preoperative team, please contact this patient and set up a phone call appointment for further preoperative risk assessment. Please obtain consent and complete medication review. Thank you for your help. ? ?I confirm that guidance regarding antiplatelet and oral anticoagulation therapy has been completed and, if necessary, noted below. ? ? ? ?[name], PA-C ?01/19/2022, 4:32 PM ?[place] ?[address] ?[place], [place] [address] ? ? ?

## 2022-01-19 NOTE — Telephone Encounter (Signed)
? ?  Pre-operative Risk Assessment  ?  ?Patient Name: [name] [name]  ?DOB: 18-Oct-1965 ?MRN: [id number]  ? ?  ? ?Request for Surgical Clearance   ? ?Procedure:   Lt gastroc recession, calcaneal osteotomy ? ?Date of Surgery:  Clearance 01/26/22                              ?   ?Surgeon:  Dr. Wylene Simmer ?Surgeon's Group or Practice Name:  [facility] ?Phone number:  [phone] ?Fax number:  [phone] ?  ?Type of Clearance Requested:   ?- Medical  ?  ?Type of Anesthesia:  General  ?  ?Additional requests/questions:  Please fax a copy of clearance to the surgeon's office.@ [email or web address] ? ?Signed, ?Desma Paganini   ?01/19/2022, 4:18 PM  ?

## 2022-01-23 NOTE — Telephone Encounter (Signed)
Left message x 2 for the pt to call the office for tele pre op appt. I will send FYI to requesting office.  ?

## 2022-01-25 NOTE — Telephone Encounter (Signed)
Patient returning call.

## 2022-01-25 NOTE — Telephone Encounter (Signed)
3rd attempt to reach pt regarding surgical clearance, left another message for pt to call back. ? ?Will send back to the requesting surgeon's office to make them aware unable to reach pt. ? ?Will get this out of our pool.  ?

## 2022-01-26 ENCOUNTER — Ambulatory Visit (HOSPITAL_BASED_OUTPATIENT_CLINIC_OR_DEPARTMENT_OTHER)
Admission: RE | Admit: 2022-01-26 | Payer: BC Managed Care – PPO | Source: Home / Self Care | Admitting: Orthopedic Surgery

## 2022-01-26 ENCOUNTER — Encounter (HOSPITAL_BASED_OUTPATIENT_CLINIC_OR_DEPARTMENT_OTHER): Admission: RE | Payer: Self-pay | Source: Home / Self Care

## 2022-01-26 ENCOUNTER — Telehealth: Payer: Self-pay

## 2022-01-26 SURGERY — OSTEOTOMY, CALCANEUS
Anesthesia: General | Laterality: Left

## 2022-01-26 NOTE — Telephone Encounter (Signed)
Patient contacted the office and scheduled telehealth appointment for today at 3pm. Med list and consent reviewed.  ?

## 2022-01-26 NOTE — Telephone Encounter (Signed)
?  Patient Consent for Virtual Visit  ? ? ?    ? ?[name] has provided verbal consent on 01/26/2022 for a virtual visit (video or telephone). ? ? ?CONSENT FOR VIRTUAL VISIT FOR:  [name] [name]  ?By participating in this virtual visit I agree to the following: ? ?I hereby voluntarily request, consent and authorize [facility] and its employed or contracted physicians, physician assistants, nurse practitioners or other licensed health care professionals (the Practitioner), to provide me with telemedicine health care services (the ?Services") as deemed necessary by the treating Practitioner. I acknowledge and consent to receive the Services by the Practitioner via telemedicine. I understand that the telemedicine visit will involve communicating with the Practitioner through live audiovisual communication technology and the disclosure of certain medical information by electronic transmission. I acknowledge that I have been given the opportunity to request an in-person assessment or other available alternative prior to the telemedicine visit and am voluntarily participating in the telemedicine visit. ? ?I understand that I have the right to withhold or withdraw my consent to the use of telemedicine in the course of my care at any time, without affecting my right to future care or treatment, and that the Practitioner or I may terminate the telemedicine visit at any time. I understand that I have the right to inspect all information obtained and/or recorded in the course of the telemedicine visit and may receive copies of available information for a reasonable fee.  I understand that some of the potential risks of receiving the Services via telemedicine include:  ?Delay or interruption in medical evaluation due to technological equipment failure or disruption; ?Information transmitted may not be sufficient (e.g. poor resolution of images) to allow for appropriate medical decision making by the Practitioner;  and/or  ?In rare instances, security protocols could fail, causing a breach of personal health information. ? ?Furthermore, I acknowledge that it is my responsibility to provide information about my medical history, conditions and care that is complete and accurate to the best of my ability. I acknowledge that Practitioner's advice, recommendations, and/or decision may be based on factors not within their control, such as incomplete or inaccurate data provided by me or distortions of diagnostic images or specimens that may result from electronic transmissions. I understand that the practice of medicine is not an exact science and that Practitioner makes no warranties or guarantees regarding treatment outcomes. I acknowledge that a copy of this consent can be made available to me via my patient portal [facility] MyChart), or I can request a printed copy by calling the office of [facility].   ? ?I understand that my insurance will be billed for this visit.  ? ?I have read or had this consent read to me. ?I understand the contents of this consent, which adequately explains the benefits and risks of the Services being provided via telemedicine.  ?I have been provided ample opportunity to ask questions regarding this consent and the Services and have had my questions answered to my satisfaction. ?I give my informed consent for the services to be provided through the use of telemedicine in my medical care ? ? ?

## 2022-01-30 ENCOUNTER — Ambulatory Visit (INDEPENDENT_AMBULATORY_CARE_PROVIDER_SITE_OTHER): Payer: BC Managed Care – PPO | Admitting: Physician Assistant

## 2022-01-30 ENCOUNTER — Other Ambulatory Visit (HOSPITAL_COMMUNITY): Payer: Self-pay | Admitting: Orthopedic Surgery

## 2022-01-30 DIAGNOSIS — Z01810 Encounter for preprocedural cardiovascular examination: Secondary | ICD-10-CM

## 2022-01-30 NOTE — Progress Notes (Signed)
? ?Virtual Visit via Telephone Note  ? ?This visit type was conducted due to national recommendations for restrictions regarding the COVID-19 Pandemic (e.g. social distancing) in an effort to limit this patient's exposure and mitigate transmission in our community.  Due to her co-morbid illnesses, this patient is at least at moderate risk for complications without adequate follow up.  This format is felt to be most appropriate for this patient at this time.  The patient did not have access to video technology/had technical difficulties with video requiring transitioning to audio format only (telephone).  All issues noted in this document were discussed and addressed.  No physical exam could be performed with this format.  Please refer to the patient's chart for her  consent to telehealth for [facility] ? ?Evaluation Performed:  Preoperative cardiovascular risk assessment ?_____________  ? ?Date:  01/30/2022  ? ?Patient ID:  [name], [name] [birth year], MRN [id number] ?Patient Location:  ?Home ?Provider location:   ?Office ? ?Primary Care Provider:  Antoine Poche, MD ?Primary Cardiologist:  None ? ?Chief Complaint  ?  ?56 y.o. y/o female with a h/o hypertensive heart disease with H and DM, who is pending Lt gastroc recession, calcaneal osteotomy, and presents today for telephonic preoperative cardiovascular risk assessment. ? ?Past Medical History  ?  ?Past Medical History:  ?Diagnosis Date  ? Diabetes mellitus without complication (HCC)   ? Hypertension   ? ?Past Surgical History:  ?Procedure Laterality Date  ? CESAREAN SECTION  2001  ? RHINOPLASTY    ? ? ?Allergies ? ?Allergies  ?Allergen Reactions  ? Ace Inhibitors Swelling and Shortness Of Breath  ? Cefoxitin Shortness Of Breath  ? Ceftin [Cefuroxime Axetil] Anaphylaxis  ? ? ?History of Present Illness  ?  ?[name] [name] is a 56 y.o. female who presents via audio/video conferencing for a telehealth visit today.  Pt was last seen in cardiology  clinic on 11/01/21 by Dr. Wyline Mood.  At that time [name] [name] was doing well well.  The patient is now pending procedure as outlined above. Since her last visit, she well. The patient denies nausea, vomiting, fever, chest pain, palpitations, shortness of breath, orthopnea, PND, dizziness, syncope, cough, congestion, abdominal pain, hematochezia, melena, lower extremity edema. Walks 2 days/week without any issue.  ? ?Home Medications  ?  ?Prior to Admission medications   ?Medication Sig Start Date End Date Taking? Authorizing Provider  ?acetaminophen (TYLENOL) 500 MG tablet Take 500 mg by mouth every 6 (six) hours as needed.     [provider]  ?albuterol (PROVENTIL HFA;VENTOLIN HFA) 108 (90 Base) MCG/ACT inhaler Inhale 1 puff into the lungs every 4 (four) hours as needed for wheezing or shortness of breath.    [provider]  ?aspirin EC 81 MG tablet Take 81 mg by mouth daily.    [provider]  ?carvedilol (COREG) 25 MG tablet Take 25 mg by mouth 2 (two) times daily with a meal.    [provider]  ?cloNIDine (CATAPRES - DOSED IN MG/24 HR) 0.2 mg/24hr patch Place 1 patch onto the skin once a week. Mondays 10/05/15   [provider]  ?fluticasone (FLONASE) 50 MCG/ACT nasal spray Place 2 sprays into both nostrils daily as needed.    [provider]  ?furosemide (LASIX) 40 MG tablet Take one tab (40mg ) alternating with 1/2 tab (20mg ) every other day 12/09/21   , MD  ?glimepiride (AMARYL) 2 MG tablet Take 1 tablet by mouth  2 (two) times daily. 05/04/19   [provider]  ?hydrALAZINE (APRESOLINE) 100 MG tablet Take 1 tablet (100 mg total) by mouth 3 (three) times daily. 11/01/21 01/26/22  Antoine Poche, MD  ?isosorbide mononitrate (IMDUR) 30 MG 24 hr tablet Take 3 tablets by mouth daily. 08/14/21   [provider]  ?metFORMIN (GLUCOPHAGE) 500 MG tablet Take 500 mg by mouth 2 (two) times daily with a meal.    [provider]  ?Multiple Vitamins-Minerals (MULTIVITAMIN WITH MINERALS) tablet Take 1 tablet by mouth daily.    [provider]  ?PENNSAID 2 % SOLN Apply 2 application topically daily as needed. 11/11/15   [provider]  ? ? ?Physical Exam  ?  ?Vital Signs:  [name] [name] does not have vital signs available for review today. ? ?Given telephonic nature of communication, physical exam is limited. ?AAOx3. NAD. Normal affect.  Speech and respirations are unlabored. ? ?Accessory Clinical Findings  ?  ?None ? ?Assessment & Plan  ?  ?1.  Preoperative Cardiovascular Risk Assessment: ? ?Given past medical history and time since last visit, based on ACC/AHA guidelines, [name] [name] would be at acceptable risk for the planned procedure without further cardiovascular testing.  ? ?The patient was advised that if she develops new symptoms prior to surgery to contact our office to arrange for a follow-up visit, and she verbalized understanding. ? ?I will route this recommendation to the requesting party via Epic fax function and remove from pre-op pool. ? ?Please call with questions. ? ?A copy of this note will be routed to requesting surgeon. ? ?Time:   ?Today, I have spent 6 minutes with the patient with telehealth technology discussing medical history, symptoms, and management plan.   ? ? ?Manson Passey, [place] ? ?01/30/2022, 2:49 PM  ?

## 2022-01-31 ENCOUNTER — Encounter (HOSPITAL_BASED_OUTPATIENT_CLINIC_OR_DEPARTMENT_OTHER): Payer: Self-pay | Admitting: Orthopedic Surgery

## 2022-01-31 ENCOUNTER — Other Ambulatory Visit: Payer: Self-pay

## 2022-02-01 ENCOUNTER — Encounter (HOSPITAL_BASED_OUTPATIENT_CLINIC_OR_DEPARTMENT_OTHER)
Admission: RE | Admit: 2022-02-01 | Discharge: 2022-02-01 | Disposition: A | Discharge status: Home / Self Care | Payer: BC Managed Care – PPO | Source: Ambulatory Visit | Attending: Orthopedic Surgery | Admitting: Orthopedic Surgery

## 2022-02-01 ENCOUNTER — Ambulatory Visit (HOSPITAL_COMMUNITY): Payer: BC Managed Care – PPO | Admitting: Anesthesiology

## 2022-02-01 DIAGNOSIS — Z01812 Encounter for preprocedural laboratory examination: Secondary | ICD-10-CM | POA: Insufficient documentation

## 2022-02-01 DIAGNOSIS — Z539 Procedure and treatment not carried out, unspecified reason: Secondary | ICD-10-CM | POA: Diagnosis present

## 2022-02-01 DIAGNOSIS — I10 Essential (primary) hypertension: Secondary | ICD-10-CM | POA: Diagnosis not present

## 2022-02-01 DIAGNOSIS — E119 Type 2 diabetes mellitus without complications: Secondary | ICD-10-CM | POA: Insufficient documentation

## 2022-02-01 LAB — BASIC METABOLIC PANEL
Anion gap: 7 (ref 5–15)
BUN: 21 mg/dL — ABNORMAL HIGH (ref 6–20)
CO2: 27 mmol/L (ref 22–32)
Calcium: 8.9 mg/dL (ref 8.9–10.3)
Chloride: 104 mmol/L (ref 98–111)
Creatinine, Ser: 1.05 mg/dL — ABNORMAL HIGH (ref 0.44–1.00)
GFR, Estimated: >60 mL/min (ref >60)
Glucose, Bld: 150 mg/dL — ABNORMAL HIGH (ref 70–99)
Potassium: 3.9 mmol/L (ref 3.5–5.1)
Sodium: 138 mmol/L (ref 135–145)

## 2022-02-01 NOTE — Progress Notes (Signed)

## 2022-02-02 ENCOUNTER — Other Ambulatory Visit: Payer: Self-pay

## 2022-02-02 ENCOUNTER — Encounter (HOSPITAL_BASED_OUTPATIENT_CLINIC_OR_DEPARTMENT_OTHER): Payer: Self-pay | Admitting: Orthopedic Surgery

## 2022-02-02 ENCOUNTER — Encounter (HOSPITAL_BASED_OUTPATIENT_CLINIC_OR_DEPARTMENT_OTHER)
Admission: RE | Disposition: A | Discharge status: Home / Self Care | Payer: Self-pay | Source: Home / Self Care | Attending: Orthopedic Surgery

## 2022-02-02 ENCOUNTER — Ambulatory Visit (HOSPITAL_BASED_OUTPATIENT_CLINIC_OR_DEPARTMENT_OTHER)
Admission: RE | Admit: 2022-02-02 | Discharge: 2022-02-02 | Disposition: A | Discharge status: Home / Self Care | Payer: BC Managed Care – PPO | Attending: Orthopedic Surgery | Admitting: Orthopedic Surgery

## 2022-02-02 DIAGNOSIS — I10 Essential (primary) hypertension: Secondary | ICD-10-CM | POA: Insufficient documentation

## 2022-02-02 DIAGNOSIS — Z539 Procedure and treatment not carried out, unspecified reason: Secondary | ICD-10-CM | POA: Insufficient documentation

## 2022-02-02 DIAGNOSIS — E119 Type 2 diabetes mellitus without complications: Secondary | ICD-10-CM | POA: Insufficient documentation

## 2022-02-02 LAB — GLUCOSE, CAPILLARY: Glucose-Capillary: 109 mg/dL — ABNORMAL HIGH (ref 70–99)

## 2022-02-02 SURGERY — OSTEOTOMY, CALCANEUS
Anesthesia: General | Laterality: Left

## 2022-02-02 MED ORDER — LABETALOL HCL 5 MG/ML IV SOLN
5.0000 mg | Freq: Once | INTRAVENOUS | Status: AC
  Administered 2022-02-02: 5 mg via INTRAVENOUS

## 2022-02-02 MED ORDER — LABETALOL HCL 5 MG/ML IV SOLN
INTRAVENOUS | Status: AC
  Filled 2022-02-02: qty 4

## 2022-02-02 MED ORDER — PROPOFOL 10 MG/ML IV BOLUS
INTRAVENOUS | Status: AC
  Filled 2022-02-02: qty 20

## 2022-02-02 MED ORDER — MIDAZOLAM HCL 2 MG/2ML IJ SOLN
INTRAMUSCULAR | Status: AC
  Filled 2022-02-02: qty 2

## 2022-02-02 MED ORDER — FENTANYL CITRATE (PF) 100 MCG/2ML IJ SOLN
INTRAMUSCULAR | Status: AC
  Filled 2022-02-02: qty 2

## 2022-02-02 MED ORDER — SODIUM CHLORIDE 0.9 % IV SOLN: INTRAVENOUS | Status: DC

## 2022-02-02 MED ORDER — CEFAZOLIN SODIUM-DEXTROSE 2-4 GM/100ML-% IV SOLN
INTRAVENOUS | Status: AC
  Filled 2022-02-02: qty 100

## 2022-02-02 MED ORDER — CEFAZOLIN SODIUM-DEXTROSE 2-4 GM/100ML-% IV SOLN
2.0000 g | INTRAVENOUS | Status: DC
Start: 2022-02-02 — End: 2022-02-02

## 2022-02-02 MED ORDER — LACTATED RINGERS IV SOLN: INTRAVENOUS | Status: DC

## 2022-02-02 NOTE — Progress Notes (Signed)
Pt arrived for surgery with high BP. Dr Nyoka Cowden (anesthesiologist) aware and IV labetalol given. Updated sister, Lynelle Smoke, who is understanding and will take patient to her primary Dr in [place] (Dr. Woody Seller) after leaving here this morning for evaluation and adjustment of meds as indicated. Patient and sister verbalized understanding and importance of compliance in this matter. Dr Nyoka Cowden spoke with them at length. ?

## 2022-02-03 NOTE — Telephone Encounter (Signed)
Pre op clearance needed added to appt notes per pre op provider. I will send FYI to requesting office the pt has appt 02/28/22 with Dr. Wyline Mood ?

## 2022-02-03 NOTE — Telephone Encounter (Signed)
Received referral for pre op clearance for Lt gastoc recession that was cancelled on 02/02/22 due to uncontrolled blood pressure. ? ?Per [facility], please send back once blood pressure is under control. ?

## 2022-02-08 ENCOUNTER — Ambulatory Visit: Payer: BC Managed Care – PPO | Admitting: Physician Assistant

## 2022-02-08 ENCOUNTER — Encounter: Payer: Self-pay | Admitting: Physician Assistant

## 2022-02-08 ENCOUNTER — Ambulatory Visit (INDEPENDENT_AMBULATORY_CARE_PROVIDER_SITE_OTHER): Payer: BC Managed Care – PPO

## 2022-02-08 VITALS — BP 146/90 | HR 80 | Ht 63.0 in | Wt 206.4 lb

## 2022-02-08 DIAGNOSIS — R29818 Other symptoms and signs involving the nervous system: Secondary | ICD-10-CM | POA: Diagnosis not present

## 2022-02-08 DIAGNOSIS — Z01818 Encounter for other preprocedural examination: Secondary | ICD-10-CM

## 2022-02-08 DIAGNOSIS — I10 Essential (primary) hypertension: Secondary | ICD-10-CM

## 2022-02-08 DIAGNOSIS — R002 Palpitations: Secondary | ICD-10-CM

## 2022-02-08 DIAGNOSIS — I110 Hypertensive heart disease with heart failure: Secondary | ICD-10-CM | POA: Diagnosis not present

## 2022-02-08 MED ORDER — SPIRONOLACTONE 50 MG PO TABS: 50.0000 mg | ORAL_TABLET | Freq: Every day | ORAL | 3 refills | Status: DC

## 2022-02-08 NOTE — Patient Instructions (Signed)
Medication Instructions:  ? ?Increase Spironolactone to 50 mg Daily  ? ?*If you need a refill on your cardiac medications before your next appointment, please call your pharmacy* ? ? ?Lab Work: ?Your physician recommends that you return for lab work in: 2 Weeks when you come for Blood Pressure Check  ? ?If you have labs (blood work) drawn today and your tests are completely normal, you will receive your results only by: ?MyChart Message (if you have MyChart) OR ?A paper copy in the mail ?If you have any lab test that is abnormal or we need to change your treatment, we will call you to review the results. ? ? ?Testing/Procedures: ?ZIO XT- Long Term Monitor Instructions  ? ?Your physician has requested you wear your ZIO patch monitor___14__days.  ?You may remove it on Feb 22, 2022 ? ?This is a single patch monitor.  [employer] supplies one patch monitor per enrollment.  Additional stickers are not available. ?  ?Please do not apply patch if you will be having a Nuclear Stress Test, Echocardiogram, Cardiac CT, MRI, or Chest Xray during the time frame you would be wearing the monitor. The patch cannot be worn during these tests.  You cannot remove and re-apply the ZIO XT patch monitor. ?  ?Your ZIO patch monitor will be sent USPS Priority mail from [facility] directly to your home address. The monitor may also be mailed to a PO BOX if home delivery is not available.   It may take 3-5 days to receive your monitor after you have been enrolled. ?  ?Once you have received you monitor, please review enclosed instructions.  Your monitor has already been registered assigning a specific monitor serial # to you. ?  ?Applying the monitor  ? ?Shave hair from upper left chest. ?  ?Hold abrader disc by orange tab.  Rub abrader in 40 strokes over left upper chest as indicated in your monitor instructions. ?  ?Clean area with 4 enclosed alcohol pads .  Use all pads to assure are is cleaned thoroughly.  Let dry.  ? ?Apply patch  as indicated in monitor instructions.  Patch will be place under collarbone on left side of chest with arrow pointing upward. ?  ?Rub patch adhesive wings for 2 minutes.Remove white label marked "1".  Remove white label marked "2".  Rub patch adhesive wings for 2 additional minutes. ?  ?While looking in a mirror, press and release button in center of patch.  A small green light will flash 3-4 times .  This will be your only indicator the monitor has been turned on. ?    ?Do not shower for the first 24 hours.  You may shower after the first 24 hours. ?  ?Press button if you feel a symptom. You will hear a small click.  Record Date, Time and Symptom in the Patient Log Book. ?  ?When you are ready to remove patch, follow instructions on last 2 pages of Patient Log Book.  Stick patch monitor onto last page of Patient Log Book. ?  ?Place Patient Log Book in [facility] box.  Use locking tab on box and tape box closed securely.  The [employer] has [employer] on it.  Please place in mailbox as soon as possible.  Your physician should have your test results approximately 7 days after the monitor has been mailed back to [facility]. ?  ?Call [facility] at [phone] if you have questions regarding your ZIO XT  patch monitor.  Call them immediately if you see an orange light blinking on your monitor. ?  ?If your monitor falls off in less than 4 days contact our Monitor department at [phone].  If your monitor becomes loose or falls off after 4 days call [employer] at [phone] for suggestions on securing your monitor.  ? ? ? ?Follow-Up: ?At [facility], you and your health needs are our priority.  As part of our continuing mission to provide you with exceptional heart care, we have created designated Provider Care Teams.  These Care Teams include your primary Cardiologist (physician) and Advanced Practice Providers (APPs -  Physician Assistants and Nurse Practitioners) who all work  together to provide you with the care you need, when you need it. ? ?We recommend signing up for the patient portal called "MyChart".  Sign up information is provided on this After Visit Summary.  MyChart is used to connect with patients for Virtual Visits (Telemedicine).  Patients are able to view lab/test results, encounter notes, upcoming appointments, etc.  Non-urgent messages can be sent to your provider as well.   ?To learn more about what you can do with MyChart, go to [email or web address].   ? ?Your next appointment:   ?2 -3 month(s) ? ?The format for your next appointment:   ?In Person ? ?Provider:   ?Carlyle Dolly, MD  ? ? ?Other Instructions ?Thank you for choosing [facility]! ? ?Your physician recommends that you schedule a follow-up appointment in: 2 weeks for a blood pressure check.  ? ? ?Important Information About Sugar ? ? ? ? ?  ?

## 2022-02-08 NOTE — Addendum Note (Signed)
Addended by: Leonides Schanz C on: 02/08/2022 03:51 PM ? ? Modules accepted: Orders ? ?

## 2022-02-08 NOTE — Progress Notes (Signed)
? ?Cardiology Office Note   ? ?Date:  02/08/2022  ? ?ID:  [name] [name], DOB [birth year], MRN [id number] ? ? ?PCP:  Glenda Chroman, MD ?  ?[place]  ?Cardiologist:  Carlyle Dolly, MD   ?Advanced Practice Provider:  No care team member to display ?Electrophysiologist:  None  ? ?[id number]  ? ?Chief Complaint  ?Patient presents with  ? Hypertension  ? Pre-op Exam  ? ? ?History of Present Illness:  ?[name] [name] is a 56 y.o. female previously followed at [facility] with cath 2015 no significant disease, with a h/o hypertensive heart disease with LVH and DM.normal renal artery [place] at [place] 2020, chronic LE edema, ACEI allergy with SOB, .  ? ?Patient saw Dr. Harl Bowie 10/2021, who referred her to pulm for OSA eval,increased hydralazine 100 mg TID, lasix increased 40 mg daily. He  recommended aldactone as next agent once aldo/renin ratio back which was up so lasix decreased to 40 mg alternating 20 mg. Echo 12/06/21 normal LVEF 60-65% with LVH, grade 1 DD. ? ? ?Patient was cleared for  Lt gastroc recession, calcaneal osteotomy via televisit 01/30/22. On 02/02/22 surgery was cancelled due to excessive HTN. BP was 195/120. They tried giving her labetalol and it didn't decrease enough. Dr. Woody Seller increased Coreg 25 mg tid and added amlodipine 5 mg and spironolactone 25 mg daily. Monday her heart was racing and BP 185/105 and she took an extra clonidine. Then her chest started hurting and has been a constant hurt. She never had palpitations before. We called her pharmacy and they said she hasn't filled clonidine patch or Imdur but she says she is taking both.  ? ? ? ?Past Medical History:  ?Diagnosis Date  ? Diabetes mellitus without complication ([place])   ? Hypertension   ? ? ?Past Surgical History:  ?Procedure Laterality Date  ? CESAREAN SECTION  2001  ? RHINOPLASTY    ? ? ?Current Medications: ?Current Meds  ?Medication Sig  ? acetaminophen (TYLENOL) 500 MG tablet Take 500 mg by mouth every 6 (six) hours as  needed.   ? albuterol (PROVENTIL HFA;VENTOLIN HFA) 108 (90 Base) MCG/ACT inhaler Inhale 1 puff into the lungs every 4 (four) hours as needed for wheezing or shortness of breath.  ? aspirin EC 81 MG tablet Take 81 mg by mouth daily.  ? carvedilol (COREG) 25 MG tablet Take 25 mg by mouth 2 (two) times daily with a meal.  ? cloNIDine (CATAPRES - DOSED IN MG/24 HR) 0.2 mg/24hr patch Place 1 patch onto the skin once a week. Mondays  ? fluticasone (FLONASE) 50 MCG/ACT nasal spray Place 2 sprays into both nostrils daily as needed.  ? furosemide (LASIX) 40 MG tablet Take one tab (40mg ) alternating with 1/2 tab (20mg ) every other day  ? glimepiride (AMARYL) 2 MG tablet Take 1 tablet by mouth 2 (two) times daily.  ? hydrALAZINE (APRESOLINE) 100 MG tablet Take 1 tablet (100 mg total) by mouth 3 (three) times daily.  ? isosorbide mononitrate (IMDUR) 30 MG 24 hr tablet Take 3 tablets by mouth daily.  ? metFORMIN (GLUCOPHAGE) 500 MG tablet Take 500 mg by mouth 2 (two) times daily with a meal.  ? Multiple Vitamins-Minerals (MULTIVITAMIN WITH MINERALS) tablet Take 1 tablet by mouth daily.  ? spironolactone (ALDACTONE) 50 MG tablet Take 1 tablet (50 mg total) by mouth daily.  ?  ? ?Allergies:   Ace inhibitors, Cefoxitin, and Ceftin [cefuroxime axetil]  ? ?Social History  ? ?Socioeconomic  History  ? Marital status: Legally Separated  ?  Spouse name: Not on file  ? Number of children: Not on file  ? Years of education: Not on file  ? Highest education level: Not on file  ?Occupational History  ? Not on file  ?Tobacco Use  ? Smoking status: Never  ? Smokeless tobacco: Never  ?Vaping Use  ? Vaping Use: Never used  ?Substance and Sexual Activity  ? Alcohol use: No  ? Drug use: No  ? Sexual activity: Not on file  ?Other Topics Concern  ? Not on file  ?Social History Narrative  ? Not on file  ? ?Social Determinants of Health  ? ?Financial Resource Strain: Not on file  ?Food Insecurity: Not on file  ?Transportation Needs: Not on file   ?Physical Activity: Not on file  ?Stress: Not on file  ?Social Connections: Not on file  ?  ? ?Family History:  The patient's  family history includes Diabetes in her father and mother; Heart disease in her father; Kidney disease in her mother.  ? ?ROS:   ?Please see the history of present illness.    ?ROS All other systems reviewed and are negative. ? ? ?PHYSICAL EXAM:   ?VS:  BP (!) 146/90   Pulse 80   Ht 5\' 3"  (1.6 m)   Wt 206 lb 6.4 oz (93.6 kg)   LMP 06/16/2019   SpO2 94%   BMI 36.56 kg/m?   ?Physical Exam  ?GEN: Obese, in no acute distress  ?Neck: no JVD, carotid bruits, or masses ?Cardiac:RRR; no murmurs, rubs, or gallops  ?Respiratory:  clear to auscultation bilaterally, normal work of breathing ?GI: soft, nontender, nondistended, + BS ?Ext: trace ankle without cyanosis, clubbing,  Good distal pulses bilaterally ?Neuro:  Alert and Oriented x 3,  ?Psych: euthymic mood, full affect ? ?Wt Readings from Last 3 Encounters:  ?02/08/22 206 lb 6.4 oz (93.6 kg)  ?02/02/22 207 lb 3.7 oz (94 kg)  ?11/01/21 202 lb 12.8 oz (92 kg)  ?  ? ? ?Studies/Labs Reviewed:  ? ?EKG:  EKG is ordered today.  The ekg reviewed from 02/06/22 NSR with LVH, lateral TW changes, EKG today NSR with LVH lateral TW changes similar to prior tracings. ? ?Recent Labs: ?11/23/2021: B Natriuretic Peptide 75.0; Magnesium 1.9 ?02/01/2022: BUN 21; Creatinine, Ser 1.05; Potassium 3.9; Sodium 138  ? ?Lipid Panel ?No results found for: CHOL, TRIG, HDL, CHOLHDL, VLDL, LDLCALC, LDLDIRECT ? ?Additional studies/ records that were reviewed today include:  ?  ?Echo 12/06/21 ?IMPRESSIONS  ? ? ? 1. Left ventricular ejection fraction, by estimation, is 60 to 65%. The  ?left ventricle has normal function. The left ventricle has no regional  ?wall motion abnormalities. There is mild left ventricular hypertrophy.  ?Left ventricular diastolic parameters  ?are consistent with Grade I diastolic dysfunction (impaired relaxation).  ? 2. Right ventricular systolic  function is normal. The right ventricular  ?size is normal.  ? 3. Left atrial size was mildly dilated.  ? 4. The mitral valve is normal in structure. No evidence of mitral valve  ?regurgitation. No evidence of mitral stenosis.  ? 5. The tricuspid valve is abnormal.  ? 6. The aortic valve is tricuspid. Aortic valve regurgitation is not  ?visualized. No aortic stenosis is present.  ? 7. Aortic dilatation noted. 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  ? 8. The inferior vena cava is normal in size with greater than 50%  ?respiratory variability,  suggesting right atrial pressure of 3 mmHg.  ? ?FINDINGS  ? Left Ventricle: Left ventricular ejection fraction, by estimation, is 60  ?to 65%. The left ventricle has normal function. The left ventricle has no  ?regional wall motion abnormalities. Global longitudinal strain performed  ?but not reported based on  ?interpreter judgement due to suboptimal tracking. The left ventricular  ?internal cavity size was normal in size. There is mild left ventricular  ?hypertrophy. Left ventricular diastolic parameters are consistent with  ?Grade I diastolic dysfunction (impaired  ?relaxation). Normal left ventricular filling pressure.  ? ?Right Ventricle: The right ventricular size is normal. No increase in  ?right ventricular wall thickness. Right ventricular systolic function is  ?normal.  ? ?2015 [facility] cath ?Diagnostic findings:  ? ?Left coronary artery:  ?Left main is angiographically normal  ?Left anterior descending is a large vessel. There is a very large diagonal one vessel. The left anterior descending has scattered mild irregularities roughly 10% in severity  ?Left circumflex artery is a large vessel. There is a moderate-sized ramus vessel. The left circumflex system has mild irregularities roughly 10% in severity  ? ?Right coronary artery:  ?Right coronary artery is a dominant artery. There is no significant stenosis noted in the right coronary  artery however there is mild ectasia noted in the mid portion of the vessel  ? ? ?Aortic pressure 146/71  ?Left ventricular ejection fraction calculated at 58% by nuclear stress test   ?  ?Risk Assessment/Calculations:   ?  ?

## 2022-02-22 ENCOUNTER — Ambulatory Visit (INDEPENDENT_AMBULATORY_CARE_PROVIDER_SITE_OTHER): Payer: BC Managed Care – PPO

## 2022-02-22 VITALS — BP 190/110 | HR 84 | Ht 63.0 in | Wt 209.0 lb

## 2022-02-22 DIAGNOSIS — Z0130 Encounter for examination of blood pressure without abnormal findings: Secondary | ICD-10-CM

## 2022-02-22 MED ORDER — AMLODIPINE BESYLATE 10 MG PO TABS: 10.0000 mg | ORAL_TABLET | Freq: Every day | ORAL | 3 refills | Status: AC

## 2022-02-22 MED ORDER — AMLODIPINE BESYLATE 5 MG PO TABS: 5.0000 mg | ORAL_TABLET | Freq: Every day | ORAL | 3 refills | Status: DC

## 2022-02-22 NOTE — Patient Instructions (Addendum)
INCREASE Amlodipine to 10 mg daily for blood pressure.   Return in 1 week for Nurse visit, BP check

## 2022-02-22 NOTE — Addendum Note (Signed)
Addended by: Marlyn Corporal A on: 02/22/2022 04:49 PM   Modules accepted: Orders

## 2022-02-23 ENCOUNTER — Ambulatory Visit: Payer: BC Managed Care – PPO

## 2022-02-28 ENCOUNTER — Ambulatory Visit: Payer: BC Managed Care – PPO | Admitting: Cardiology

## 2022-03-01 ENCOUNTER — Ambulatory Visit (INDEPENDENT_AMBULATORY_CARE_PROVIDER_SITE_OTHER): Payer: BC Managed Care – PPO | Admitting: *Deleted

## 2022-03-01 VITALS — BP 186/118 | HR 84 | Ht 63.5 in | Wt 206.0 lb

## 2022-03-01 DIAGNOSIS — Z0130 Encounter for examination of blood pressure without abnormal findings: Secondary | ICD-10-CM

## 2022-03-01 MED ORDER — CLONIDINE 0.3 MG/24HR TD PTWK: 0.3000 mg | MEDICATED_PATCH | TRANSDERMAL | 12 refills | Status: DC

## 2022-03-01 NOTE — Patient Instructions (Signed)
Medication Instructions:  Increase Clonidine patch to 0.3 mg weekly   Labwork: NONE   Testing/Procedures: NONE   Follow-Up: As Planned   Any Other Special Instructions Will Be Listed Below (If Applicable).     If you need a refill on your cardiac medications before your next appointment, please call your pharmacy.

## 2022-03-01 NOTE — Progress Notes (Signed)
Pt states that during last visit she thought she was taking Norvasc 5 mg daily, when she was actually taking 10 mg daily. Pt states that she did not pick up new script b/c she was already taking it. Pt has had an episode of getting dizzy while a work. Pt states that BP was elevated this morning when she checked her BP. BP today was 152/90. On recheck BP is 180/100 Pt states that she took her Clonidine patch off on yesterday and did not replace it. Chart review and discussed with Randall An, PA. Instructed to increase Clonidine patch to 0.3 mg.

## 2022-03-09 NOTE — H&P (Signed)
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  The patient presented to the [facility] day surgery center for surgery.  She was found to have uncontrolled hypertension.  Surgery was canceled.  Surgery will be rescheduled once she has appropriate cardiology evaluation.

## 2022-04-25 ENCOUNTER — Encounter: Payer: Self-pay | Admitting: Cardiology

## 2022-04-25 ENCOUNTER — Ambulatory Visit: Payer: BC Managed Care – PPO | Admitting: Cardiology

## 2022-04-25 VITALS — BP 148/92 | HR 72 | Ht 64.0 in | Wt 215.6 lb

## 2022-04-25 DIAGNOSIS — I10 Essential (primary) hypertension: Secondary | ICD-10-CM

## 2022-04-25 DIAGNOSIS — I110 Hypertensive heart disease with heart failure: Secondary | ICD-10-CM | POA: Diagnosis not present

## 2022-04-25 DIAGNOSIS — I890 Lymphedema, not elsewhere classified: Secondary | ICD-10-CM

## 2022-04-25 MED ORDER — LOSARTAN POTASSIUM 25 MG PO TABS: 25.0000 mg | ORAL_TABLET | Freq: Every day | ORAL | 6 refills | Status: DC

## 2022-04-25 NOTE — Patient Instructions (Addendum)
Medication Instructions:  Begin Losartan 25mg  daily  Continue all other medications.     Labwork: BMET - order given today  Please do in 2 weeks (around 05/09/22) Office will contact with results via phone, letter or mychart.     Testing/Procedures: none  Follow-Up: 4 months   Any Other Special Instructions Will Be Listed Below (If Applicable). You have been referred to Hypertension Clinic You have been referred to Lymphedema Clinic     If you need a refill on your cardiac medications before your next appointment, please call your pharmacy.

## 2022-04-25 NOTE — Progress Notes (Signed)
Clinical Summary Ms. Labella is a 56 y.o.female seen today as a new patient for the following medical problems.      Previosuly followed at [facility] cardiology, last clinic note 2015     Chest pain - cath at [facility] without significant disease - no recent symptoms.        2. LVH secondary to hypertensive heart disease - mentioned in [facility] notes     3. Leg edema - ongoing edema in legs, she is on lasix '20mg'$  daily.  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 SHe reports pcp did home sleep study few weeks ago but has not had results.  -10/2021 Aldo/renin 62 ratio however normal aldo levels.  -home bp's 140-150/10s - compliant with meds   5.OSA screen - troubles with at home sleep machine, reports ordered by pcp but has not had results  Past Medical History:  Diagnosis Date   Diabetes mellitus without complication (HCC)    Hypertension      Allergies  Allergen Reactions   Ace Inhibitors Swelling and Shortness Of Breath   Cefoxitin Shortness Of Breath   Ceftin [Cefuroxime Axetil] Anaphylaxis     Current Outpatient Medications  Medication Sig Dispense Refill   acetaminophen (TYLENOL) 500 MG tablet Take 500 mg by mouth every 6 (six) hours as needed.      albuterol (PROVENTIL HFA;VENTOLIN HFA) 108 (90 Base) MCG/ACT inhaler Inhale 1 puff into the lungs every 4 (four) hours as needed for wheezing or shortness of breath.     amLODipine (NORVASC) 10 MG tablet Take 1  tablet (10 mg total) by mouth daily. 90 tablet 3   aspirin EC 81 MG tablet Take 81 mg by mouth daily.     carvedilol (COREG) 25 MG tablet Take 25 mg by mouth 2 (two) times daily with a meal.     cloNIDine (CATAPRES - DOSED IN MG/24 HR) 0.3 mg/24hr patch Place 1 patch (0.3 mg total) onto the skin once a week. 4 patch 12   fluticasone (FLONASE) 50 MCG/ACT nasal spray Place 2 sprays into both nostrils daily as needed.     furosemide (LASIX) 40 MG tablet Take one tab ('40mg'$ ) alternating with 1/2 tab ('20mg'$ ) every other day     glimepiride (AMARYL) 2 MG tablet Take 1 tablet by mouth 2 (two) times daily.     hydrALAZINE (APRESOLINE) 100 MG tablet Take 1 tablet (100 mg total) by mouth 3 (three) times daily. 90 tablet 6   isosorbide mononitrate (IMDUR) 30 MG 24 hr tablet Take 3 tablets by mouth daily.     metFORMIN (GLUCOPHAGE) 500 MG tablet Take 500 mg by mouth 2 (two) times daily with a meal.     Multiple Vitamins-Minerals (MULTIVITAMIN WITH MINERALS) tablet Take 1 tablet by mouth daily.     PENNSAID 2 % SOLN Apply 2 application. topically daily as needed.  0   spironolactone (ALDACTONE) 50 MG tablet Take 1 tablet (50 mg total) by mouth daily. 90 tablet 3  No current facility-administered medications for this visit.     Past Surgical History:  Procedure Laterality Date   CESAREAN SECTION  2001   RHINOPLASTY       Allergies  Allergen Reactions   Ace Inhibitors Swelling and Shortness Of Breath   Cefoxitin Shortness Of Breath   Ceftin [Cefuroxime Axetil] Anaphylaxis      Family History  Problem Relation Age of Onset   Diabetes Mother    Kidney disease Mother    Diabetes Father    Heart disease Father      Social History Ms. Girouard reports that she has never smoked. She has never used smokeless tobacco. Ms. Nachbar reports no history of alcohol use.   Review of Systems CONSTITUTIONAL: No weight loss, fever, chills, weakness or fatigue.  HEENT: Eyes: No visual loss, blurred  vision, double vision or yellow sclerae.No hearing loss, sneezing, congestion, runny nose or sore throat.  SKIN: No rash or itching.  CARDIOVASCULAR: per hpi RESPIRATORY: No shortness of breath, cough or sputum.  GASTROINTESTINAL: No anorexia, nausea, vomiting or diarrhea. No abdominal pain or blood.  GENITOURINARY: No burning on urination, no polyuria NEUROLOGICAL: No headache, dizziness, syncope, paralysis, ataxia, numbness or tingling in the extremities. No change in bowel or bladder control.  MUSCULOSKELETAL: No muscle, back pain, joint pain or stiffness.  LYMPHATICS: No enlarged nodes. No history of splenectomy.  PSYCHIATRIC: No history of depression or anxiety.  ENDOCRINOLOGIC: No reports of sweating, cold or heat intolerance. No polyuria or polydipsia.  Marland Kitchen   Physical Examination Today's Vitals   04/25/22 1511  BP: (!) 148/92  Pulse: 72  SpO2: 97%  Weight: 215 lb 9.6 oz (97.8 kg)  Height: '5\' 4"'$  (1.626 m)   Body mass index is 37.01 kg/m.  Gen: resting comfortably, no acute distress HEENT: no scleral icterus, pupils equal round and reactive, no palptable cervical adenopathy,  CV: RRR, no m/r/g, no jvd Resp: Clear to auscultation bilaterally GI: abdomen is soft, non-tender, non-distended, normal bowel sounds, no hepatosplenomegaly MSK: extremities are warm, 1+ bilatearl non pitting edema Skin: warm, no rash Neuro:  no focal deficits Psych: appropriate affect   Diagnostic Studies 2015 [facility] cath Diagnostic findings:   Left coronary artery:  Left main is angiographically normal  Left anterior descending is a large vessel. There is a very large diagonal one vessel. The left anterior descending has scattered mild irregularities roughly 10% in severity  Left circumflex artery is a large vessel. There is a moderate-sized ramus vessel. The left circumflex system has mild irregularities roughly 10% in severity   Right coronary artery:  Right coronary artery is a dominant  artery. There is no significant stenosis noted in the right coronary artery however there is mild ectasia noted in the mid portion of the vessel    Aortic pressure 146/71  Left ventricular ejection fraction calculated at 58% by nuclear stress test    11/2021 echo IMPRESSIONS     1. Left ventricular ejection fraction, by estimation, is 60 to 65%. The  left ventricle has normal function. The left ventricle has no regional  wall motion abnormalities. There is mild left ventricular hypertrophy.  Left ventricular diastolic parameters  are consistent with Grade I diastolic dysfunction (impaired relaxation).   2. Right ventricular systolic function is normal. The right ventricular  size is normal.   3. Left atrial size was mildly dilated.   4. The mitral valve is normal in structure. No evidence of mitral valve  regurgitation. No evidence of mitral  stenosis.   5. The tricuspid valve is abnormal.   6. The aortic valve is tricuspid. Aortic valve regurgitation is not  visualized. No aortic stenosis is present.   7. Aortic dilatation noted. There is mild dilatation of the ascending  aorta, measuring 37 mm.   8. The inferior vena cava is normal in size with greater than 50%  respiratory variability, suggesting right atrial pressure of 3 mmHg.   02/2022 heart monitor 14 day monitor Rare supraventricular ectopy in the form of isolated PACs, couplets, triplets. 3 runs of SVT longest 6 beats Rare ventricular ectopy in the form of isolated PVCs, couplets, triplets Patient triggered events but no reported symptoms   Assessment and Plan   1.Resistant HTN - severe HTN, long history of which the management is somewhat unclear - appears had normal renal artery [place] at [facility] in 2020,also negative workup for pheo at that time.  - -elevated aldo/renin ratio but normal overall aldo levels(tested prior to starting aldactone) - sleep study pending with pcp - ACEi allergy, im unclear if she has been tried on  ARB.  - will start losartan '25mg'$  daily - refer to HTN clinic   2.LE edema - I think combination of diastolic dysfunction but also lymphedema - uptrend in Cr with higher lasix dosing of '40mg'$  daily, on '40mg'$  alt days with '20mg'$  renal function back to baseline - continue diuretic, refer to lymphedema clinic.         Arnoldo Lenis, M.D.

## 2022-07-19 ENCOUNTER — Encounter: Payer: Self-pay | Admitting: Cardiovascular Disease

## 2022-07-31 ENCOUNTER — Ambulatory Visit: Payer: BC Managed Care – PPO | Admitting: Cardiology

## 2022-08-01 ENCOUNTER — Ambulatory Visit (HOSPITAL_BASED_OUTPATIENT_CLINIC_OR_DEPARTMENT_OTHER): Payer: BC Managed Care – PPO | Admitting: Cardiovascular Disease

## 2022-08-03 ENCOUNTER — Ambulatory Visit (HOSPITAL_BASED_OUTPATIENT_CLINIC_OR_DEPARTMENT_OTHER): Payer: BC Managed Care – PPO | Admitting: Cardiovascular Disease

## 2022-08-28 ENCOUNTER — Ambulatory Visit (HOSPITAL_COMMUNITY): Payer: BC Managed Care – PPO | Attending: Cardiology | Admitting: Physical Therapy

## 2022-08-28 DIAGNOSIS — I1A0 Resistant hypertension: Secondary | ICD-10-CM | POA: Insufficient documentation

## 2022-08-28 DIAGNOSIS — I160 Hypertensive urgency: Secondary | ICD-10-CM | POA: Insufficient documentation

## 2022-08-28 DIAGNOSIS — Z79899 Other long term (current) drug therapy: Secondary | ICD-10-CM | POA: Insufficient documentation

## 2022-08-28 DIAGNOSIS — I119 Hypertensive heart disease without heart failure: Secondary | ICD-10-CM | POA: Insufficient documentation

## 2022-08-28 DIAGNOSIS — N183 Chronic kidney disease, stage 3 unspecified: Secondary | ICD-10-CM | POA: Insufficient documentation

## 2022-08-28 DIAGNOSIS — E669 Obesity, unspecified: Secondary | ICD-10-CM | POA: Insufficient documentation

## 2022-08-28 DIAGNOSIS — I890 Lymphedema, not elsewhere classified: Secondary | ICD-10-CM | POA: Insufficient documentation

## 2022-08-30 ENCOUNTER — Encounter (HOSPITAL_COMMUNITY): Payer: BC Managed Care – PPO

## 2022-09-01 ENCOUNTER — Encounter (HOSPITAL_COMMUNITY): Payer: BC Managed Care – PPO

## 2022-09-04 ENCOUNTER — Encounter (HOSPITAL_COMMUNITY): Payer: BC Managed Care – PPO | Admitting: Physical Therapy

## 2022-09-06 ENCOUNTER — Encounter (HOSPITAL_COMMUNITY): Payer: BC Managed Care – PPO

## 2022-09-08 ENCOUNTER — Encounter (HOSPITAL_COMMUNITY): Payer: BC Managed Care – PPO | Admitting: Physical Therapy

## 2022-09-11 ENCOUNTER — Encounter (HOSPITAL_COMMUNITY): Payer: BC Managed Care – PPO | Admitting: Physical Therapy

## 2022-09-13 ENCOUNTER — Encounter (HOSPITAL_COMMUNITY): Payer: BC Managed Care – PPO | Admitting: Physical Therapy

## 2022-09-15 ENCOUNTER — Encounter (HOSPITAL_COMMUNITY): Payer: BC Managed Care – PPO

## 2022-09-15 ENCOUNTER — Ambulatory Visit: Payer: BC Managed Care – PPO | Admitting: Nurse Practitioner

## 2022-09-15 ENCOUNTER — Encounter: Payer: Self-pay | Admitting: Nurse Practitioner

## 2022-09-15 VITALS — BP 195/125 | HR 93 | Ht 63.5 in | Wt 219.6 lb

## 2022-09-15 DIAGNOSIS — N183 Chronic kidney disease, stage 3 unspecified: Secondary | ICD-10-CM

## 2022-09-15 DIAGNOSIS — I1A0 Resistant hypertension: Secondary | ICD-10-CM

## 2022-09-15 DIAGNOSIS — I119 Hypertensive heart disease without heart failure: Secondary | ICD-10-CM

## 2022-09-15 DIAGNOSIS — I890 Lymphedema, not elsewhere classified: Secondary | ICD-10-CM

## 2022-09-15 DIAGNOSIS — I160 Hypertensive urgency: Secondary | ICD-10-CM | POA: Diagnosis not present

## 2022-09-15 DIAGNOSIS — Z79899 Other long term (current) drug therapy: Secondary | ICD-10-CM

## 2022-09-15 DIAGNOSIS — E669 Obesity, unspecified: Secondary | ICD-10-CM

## 2022-09-15 MED ORDER — CARVEDILOL 25 MG PO TABS: 50.0000 mg | ORAL_TABLET | Freq: Two times a day (BID) | ORAL | 6 refills | Status: DC

## 2022-09-15 MED ORDER — LOSARTAN POTASSIUM 50 MG PO TABS: 50.0000 mg | ORAL_TABLET | Freq: Every day | ORAL | 6 refills | Status: DC

## 2022-09-15 MED ORDER — SPIRONOLACTONE 50 MG PO TABS: 50.0000 mg | ORAL_TABLET | Freq: Every day | ORAL | 6 refills | Status: DC

## 2022-09-15 NOTE — Progress Notes (Signed)
Cardiology Office Note:    Date:  09/15/2022   ID:  [name] [name], DOB 06-23-1966, MRN [id number]  PCP:  Glenda Chroman, MD   [facility] Cardiologist:  Carlyle Dolly, MD     Referring MD: Glenda Chroman, MD   CC: Here for follow-up  History of Present Illness:    [name] [name] is a 56 y.o. female with a hx of the following:  Hypertensive heart disease LVH Leg edema Resistant HTN Snoring, daytime somnolence OSA screening CKD stage 3  Patient is a very pleasant 56 year old female with past medical history as mentioned above.  She has previously been followed by [facility].  In 2015, she underwent cardiac catheterization did not reveal significant coronary artery disease, has history of LVH secondary to hypertensive heart disease.  Echocardiogram in March 2023 revealed normal EF, grade 1 DD, mild LVH.  She has a longstanding history of resistant hypertension.  [facility] notes show that she had renal artery duplex performed that was negative for critical renal artery stenosis. Metanephrine and normetanephrine levels were normal. Previous Also/renin levels normal.   Last seen by Dr. Carlyle Dolly on April 25, 2022.  She denied any chest pain.  Did note some leg edema, she was limiting her sodium intake, but continued no lower extremity swelling.  She was compliant with her medications.  SBP averaging 140s to 150s.  She was being screened for OSA by her PCP, but not had results.  Was started on losartan 25 mg daily for history of resistant hypertension and was referred to hypertension clinic.  Dr. Carlyle Dolly felt that her leg edema was due to a combination of diastolic dysfunction and lymphedema. Was on 40 mg daily with alternative 20 mg daily of Lasix, her renal function returned back to baseline.  She was referred to lymphedema clinic. Told to follow-up in 4 months.   Today she presents for 4 month follow-up. She states she just got off work  (works 3 PM - 3 AM) for a [employer]. Has not had good sleep since getting off work. CC is elevated BP readings recently. SBP at home running in 160s to 180s. Compliant with her meds. Does admit to stress and caffeine intake. Denies tobacco use. States she is due to change her clonidine patch this week. Denies any CP, SHOB, palpitations, syncope, presyncope, dizziness, orthopnea, PND, significant weight changes, worsening leg edema, or claudication. 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 Requesting a refill on Aldactone. States she was told in the past she has a history of CKD stage 3. Denies any other questions or concerns today.    Past Medical History:  Diagnosis Date   Chronic kidney disease (CKD)    Self reported as stage 3   Diabetes mellitus without complication ([place])    Hypertensive heart disease    Hypertensive urgency    Left ventricular hypertrophy    Lymphedema    Resistant Hypertension    sees Adv HTN Clinic    Past Surgical History:  Procedure Laterality Date   CESAREAN SECTION  2001   RHINOPLASTY      Current Medications: Current Meds  Medication Sig   acetaminophen (TYLENOL) 500 MG tablet Take 500 mg by mouth every 6 (six) hours as needed.    albuterol (PROVENTIL HFA;VENTOLIN HFA) 108 (90 Base) MCG/ACT inhaler Inhale 1 puff into the lungs every 4 (four) hours as needed for wheezing or shortness of breath.   amLODipine (  NORVASC) 10 MG tablet Take 1 tablet (10 mg total) by mouth daily.   aspirin EC 81 MG tablet Take 81 mg by mouth daily.   cloNIDine (CATAPRES - DOSED IN MG/24 HR) 0.3 mg/24hr patch Place 1 patch (0.3 mg total) onto the skin once a week.   fluticasone (FLONASE) 50 MCG/ACT nasal spray Place 2 sprays into both nostrils daily as needed.   furosemide (LASIX) 40 MG tablet Take one tab (67m) alternating with 1/2 tab (225m every other day   glimepiride (AMARYL) 2 MG tablet Take 1 tablet by mouth 2 (two) times daily.   hydrALAZINE  (APRESOLINE) 100 MG tablet Take 1 tablet (100 mg total) by mouth 3 (three) times daily.   isosorbide mononitrate (IMDUR) 30 MG 24 hr tablet Take 3 tablets by mouth daily.   metFORMIN (GLUCOPHAGE) 500 MG tablet Take 500 mg by mouth 2 (two) times daily with a meal.   Multiple Vitamins-Minerals (MULTIVITAMIN WITH MINERALS) tablet Take 1 tablet by mouth daily.   PENNSAID 2 % SOLN Apply 2 application. topically daily as needed.   carvedilol (COREG) 25 MG tablet Take 25 mg by mouth 2 (two) times daily with a meal.   losartan (COZAAR) 25 MG tablet Take 1 tablet (25 mg total) by mouth daily.   spironolactone (ALDACTONE) 50 MG tablet Take 1 tablet (50 mg total) by mouth daily.     Allergies:   Ace inhibitors, Cefoxitin, and Ceftin [cefuroxime axetil]   Social History   Socioeconomic History   Marital status: Legally Separated    Spouse name: Not on file   Number of children: Not on file   Years of education: Not on file   Highest education level: Not on file  Occupational History   Not on file  Tobacco Use   Smoking status: Never   Smokeless tobacco: Never  Vaping Use   Vaping Use: Never used  Substance and Sexual Activity   Alcohol use: No   Drug use: No   Sexual activity: Not on file  Other Topics Concern   Not on file  Social History Narrative   Not on file   Social Determinants of Health   Financial Resource Strain: Not on file  Food Insecurity: Not on file  Transportation Needs: Not on file  Physical Activity: Not on file  Stress: Not on file  Social Connections: Not on file     Family History: The patient's family history includes Diabetes in her father and mother; Heart disease in her father; Kidney disease in her mother.  ROS:   Review of Systems  Constitutional:  Positive for malaise/fatigue. Negative for chills, diaphoresis, fever and weight loss.  HENT: Negative.    Eyes: Negative.   Respiratory: Negative.    Cardiovascular: Negative.   Gastrointestinal:  Negative.   Genitourinary: Negative.   Musculoskeletal: Negative.   Skin: Negative.   Neurological: Negative.   Endo/Heme/Allergies: Negative.   Psychiatric/Behavioral:  Negative for depression, hallucinations, memory loss, substance abuse and suicidal ideas. The patient is nervous/anxious and has insomnia.     Please see the history of present illness.    All other systems reviewed and are negative.  EKGs/Labs/Other Studies Reviewed:    The following studies were reviewed today:   EKG:  EKG is not ordered today.  Cardiac monitor on 03/21/2022: 14 day monitor Rare supraventricular ectopy in the form of isolated PACs, couplets, triplets. 3 runs of SVT longest 6 beats Rare ventricular ectopy in the form of isolated PVCs, couplets, triplets  Patient triggered events but no reported symptoms   2D Echo on 12/06/2021:  1. Left ventricular ejection fraction, by estimation, is 60 to 65%. The  left ventricle has normal function. The left ventricle has no regional  wall motion abnormalities. There is mild left ventricular hypertrophy.  Left ventricular diastolic parameters  are consistent with Grade I diastolic dysfunction (impaired relaxation).   2. Right ventricular systolic function is normal. The right ventricular  size is normal.   3. Left atrial size was mildly dilated.   4. The mitral valve is normal in structure. No evidence of mitral valve  regurgitation. No evidence of mitral stenosis.   5. The tricuspid valve is abnormal.   6. The aortic valve is tricuspid. Aortic valve regurgitation is not  visualized. No aortic stenosis is present.   7. Aortic dilatation noted. There is mild dilatation of the ascending  aorta, measuring 37 mm.   8. The inferior vena cava is normal in size with greater than 50%  respiratory variability, suggesting right atrial pressure of 3 mmHg.   Recent Labs: 11/23/2021: B Natriuretic Peptide 75.0; Magnesium 1.9 02/01/2022: BUN 21; Creatinine, Ser 1.05;  Potassium 3.9; Sodium 138  Recent Lipid Panel No results found for: "CHOL", "TRIG", "HDL", "CHOLHDL", "VLDL", "LDLCALC", "LDLDIRECT"   Physical Exam:    VS:  BP (!) 195/125 (BP Location: Right Arm, Patient Position: Sitting, Cuff Size: Large)   Pulse 93   Ht 5' 3.5" (1.613 m)   Wt 219 lb 9.6 oz (99.6 kg)   LMP 06/16/2019   SpO2 98%   BMI 38.29 kg/m     Wt Readings from Last 3 Encounters:  09/15/22 219 lb 9.6 oz (99.6 kg)  04/25/22 215 lb 9.6 oz (97.8 kg)  03/01/22 206 lb (93.4 kg)     Vitals:   09/15/22 0945 09/15/22 0946  BP: (!) 220/115 (!) 195/125  Pulse:    SpO2:       GEN: Obese, 56 y.o. female in no acute distress, appears fatigued HEENT: Normal NECK: No JVD; No carotid bruits CARDIAC: S1/S2, normal rate and regular rhythm, no murmurs, rubs, gallops; 2+ peripheral pulses throughout, strong bilaterally RESPIRATORY:  Clear and diminished to auscultation without rales, wheezing or rhonchi  MUSCULOSKELETAL:  bilateral lymphedema, nonpitting along BLE, otherwise normal; No deformity  SKIN: Warm and dry NEUROLOGIC:  Alert and oriented x 3 PSYCHIATRIC:  Normal, pleasant affect   ASSESSMENT:    1. Hypertensive urgency   2. Resistant hypertension   3. Hypertensive heart disease without heart failure   4. Medication management   5. Stage 3 chronic kidney disease, unspecified whether stage 3a or 3b CKD (HCC)   6. Obesity (BMI 30-39.9)   7. Lymphedema    PLAN:    In order of problems listed above:  Hypertensive urgency, resistant HTN, medication management, hypertensive heart disease I was alerted by LPN before entering room about SBP readings around 200's and I was asked if I could recheck these. I checked bilateral arm pressures to reveal findings of hypertensive urgency. L arm BP 220/115 and R arm BP around 195/125. Asymptomatic with these BP's. States she took her meds around 8:30 AM before appt today. She is due for clonidine patch change. I recommended ED  evaluation per protocol, however she politely declines. Told her risks associated with no ED eval, and she verbalized understanding. Told her we will make the following adjustments to her medications today: Increase Carvedilol from 25 mg BID to 50 mg BID and increase  losartan from 25 mg daily to 50 mg daily, switch out Clonidine weekly patch today. I will call her 24 hours from now with a BP check to see if there are improvements in her BP readings. Recommended avoiding caffeine and salt intake. Recommended good sleeping habits. Will check BMET and thyroid panel in 2 weeks. If no improvement, plan to recommend evaluation at [facility] ED. Continue current regimen of antihypertensives. [facility] notes show that she had renal artery duplex performed that was negative for critical renal artery stenosis. Metanephrine and normetanephrine levels were normal. Previous Also/renin levels normal. Will have her f/u with Advanced HTN clinic and our clinic nurse for BP checks all within 4-6 weeks. Recommend updating 2D Echo at next follow-up, as proBNP elevated 2 months ago, discuss sleep apnea testing. Euvolemic and well compensated on exam. Does have hx of lymphedema. Continue lasix. Heart healthy diet and regular cardiovascular exercise encouraged.   2. CKD stage 3 Last kidney function panel on file shows sCr 1.21 with eGFR at 54. Will repeat BMET in 2 weeks as mentioned above.   3. Obesity BMI today 38.29. Weight loss via diet and exercise encouraged. Discussed the impact being overweight would have on cardiovascular risk. Heart healthy diet and regular cardiovascular exercise encouraged.   4. Lymphedema Stable leg edema. Follow up with lymphedema clinic as scheduled. Low salt, heart healthy diet and regular cardiovascular exercise encouraged.   5. Disposition: Follow up with Adv HTN Clinic in 3-4 weeks, BP RN check in 6 weeks. F/U with Dr. Carlyle Dolly in 4-6 months. Recommend repeating 2D Echo to see LV function  and discuss sleep apnea evaluation.    Medication Adjustments/Labs and Tests Ordered: Current medicines are reviewed at length with the patient today.  Concerns regarding medicines are outlined above.  Orders Placed This Encounter  Procedures   Basic metabolic panel   AMB REFERRAL TO ADVANCED HTN CLINIC   Meds ordered this encounter  Medications   carvedilol (COREG) 25 MG tablet    Sig: Take 2 tablets (50 mg total) by mouth 2 (two) times daily.    Dispense:  120 tablet    Refill:  6    Dose increased 09/15/2022   losartan (COZAAR) 50 MG tablet    Sig: Take 1 tablet (50 mg total) by mouth daily.    Dispense:  30 tablet    Refill:  6    Dose increased 09/15/2022   spironolactone (ALDACTONE) 50 MG tablet    Sig: Take 1 tablet (50 mg total) by mouth daily.    Dispense:  30 tablet    Refill:  6    Patient Instructions  Medication Instructions:  Increase Coreg to 48m twice a day   Increase Losartan to 53mdaily  Spironolactone refilled today  Continue all other medications.     Labwork: BMET - order given today Please do in 2 weeks (around 09/29/2022) Office will contact with results via phone, letter or mychart.     Testing/Procedures: none  Follow-Up: 4-6 months   Any Other Special Instructions Will Be Listed Below (If Applicable). Your physician has requested that you regularly monitor and record your blood pressure readings at home. Please use the same machine at the same time of day to check your readings and record them to bring to your follow-up visit.  Nurse visit blood pressure check in 6 weeks You have been referred to Hypertension Clinic  [facility] six info given today  If you need a refill on your  cardiac medications before your next appointment, please call your pharmacy.    SignedFinis Bud, NP  09/15/2022 11:00 AM    [place]

## 2022-09-15 NOTE — Patient Instructions (Signed)
Medication Instructions:  Increase Coreg to 50mg  twice a day   Increase Losartan to 50mg  daily  Spironolactone refilled today  Continue all other medications.     Labwork: BMET - order given today Please do in 2 weeks (around 09/29/2022) Office will contact with results via phone, letter or mychart.     Testing/Procedures: none  Follow-Up: 4-6 months   Any Other Special Instructions Will Be Listed Below (If Applicable). Your physician has requested that you regularly monitor and record your blood pressure readings at home. Please use the same machine at the same time of day to check your readings and record them to bring to your follow-up visit.  Nurse visit blood pressure check in 6 weeks You have been referred to Hypertension Clinic  [facility] six info given today  If you need a refill on your cardiac medications before your next appointment, please call your pharmacy.

## 2022-09-16 ENCOUNTER — Telehealth: Payer: Self-pay | Admitting: Nurse Practitioner

## 2022-09-16 NOTE — Telephone Encounter (Signed)
Called and s/w patient today at 10:00 AM regarding her recent blood pressures.   BP log is as follows:  09/15/22 PM: 187/99 09/16/22 AM: 146/90  She states she is feeling much better, and her BP has improved considerably. Discussed with her that we will continue current medication management and repeat BMET with thyroid panel in 2 weeks. I stated to her that if she has elevated BP's as she did at office visit yesterday, she will need to go to [facility] ED to seek treatment. She verbalized understanding and was appreciative of my call.   Sharlene Dory, NP 09/16/2022 10:05

## 2022-09-19 ENCOUNTER — Encounter (HOSPITAL_COMMUNITY): Payer: BC Managed Care – PPO

## 2022-09-21 ENCOUNTER — Encounter (HOSPITAL_COMMUNITY): Payer: BC Managed Care – PPO | Admitting: Physical Therapy

## 2022-09-22 ENCOUNTER — Encounter (HOSPITAL_COMMUNITY): Payer: BC Managed Care – PPO

## 2022-09-26 ENCOUNTER — Encounter (HOSPITAL_COMMUNITY): Payer: BC Managed Care – PPO

## 2022-09-28 ENCOUNTER — Encounter (HOSPITAL_COMMUNITY): Payer: BC Managed Care – PPO | Admitting: Physical Therapy

## 2022-09-29 ENCOUNTER — Ambulatory Visit (HOSPITAL_COMMUNITY): Payer: BC Managed Care – PPO | Admitting: Physical Therapy

## 2022-10-02 ENCOUNTER — Encounter (HOSPITAL_COMMUNITY): Payer: BC Managed Care – PPO | Admitting: Physical Therapy

## 2022-10-04 ENCOUNTER — Encounter (HOSPITAL_COMMUNITY): Payer: BC Managed Care – PPO | Admitting: Physical Therapy

## 2022-10-06 ENCOUNTER — Encounter (HOSPITAL_COMMUNITY): Payer: BC Managed Care – PPO

## 2022-10-13 ENCOUNTER — Encounter: Payer: Self-pay | Admitting: *Deleted

## 2022-10-24 ENCOUNTER — Ambulatory Visit: Payer: BC Managed Care – PPO | Attending: Cardiology

## 2022-12-14 DIAGNOSIS — R270 Ataxia, unspecified: Secondary | ICD-10-CM | POA: Insufficient documentation

## 2022-12-15 DIAGNOSIS — Z8673 Personal history of transient ischemic attack (TIA), and cerebral infarction without residual deficits: Secondary | ICD-10-CM | POA: Insufficient documentation

## 2022-12-15 DIAGNOSIS — R7989 Other specified abnormal findings of blood chemistry: Secondary | ICD-10-CM | POA: Insufficient documentation

## 2023-01-23 ENCOUNTER — Encounter: Payer: Self-pay | Admitting: Nurse Practitioner

## 2023-01-23 ENCOUNTER — Ambulatory Visit: Payer: BC Managed Care – PPO | Attending: Nurse Practitioner | Admitting: Nurse Practitioner

## 2023-01-23 VITALS — BP 160/124 | HR 93 | Ht 63.5 in | Wt 203.0 lb

## 2023-01-23 DIAGNOSIS — Z87898 Personal history of other specified conditions: Secondary | ICD-10-CM

## 2023-01-23 DIAGNOSIS — I1A0 Resistant hypertension: Secondary | ICD-10-CM | POA: Diagnosis not present

## 2023-01-23 DIAGNOSIS — I639 Cerebral infarction, unspecified: Secondary | ICD-10-CM | POA: Insufficient documentation

## 2023-01-23 DIAGNOSIS — R531 Weakness: Secondary | ICD-10-CM | POA: Insufficient documentation

## 2023-01-23 DIAGNOSIS — I161 Hypertensive emergency: Secondary | ICD-10-CM

## 2023-01-23 DIAGNOSIS — R2990 Unspecified symptoms and signs involving the nervous system: Secondary | ICD-10-CM | POA: Diagnosis not present

## 2023-01-23 NOTE — Progress Notes (Signed)
Cardiology Office Note:    Date:  01/23/2023   ID:  [name] [name], DOB [birth year], MRN [id number]  PCP:  Ignatius Specking, MD   [facility] Providers Cardiologist:  Dina Rich, MD     Referring MD: Ignatius Specking, MD   CC: Here for follow-up  History of Present Illness:    [name] [name] is a 57 y.o. female with a hx of the following:  Hypertensive heart disease LVH Leg edema Resistant HTN Snoring, daytime somnolence OSA screening CKD stage 3  Patient is a very pleasant 57 year old female with past medical history as mentioned above.  She has previously been followed by [facility].  In 2015, she underwent cardiac catheterization did not reveal significant coronary artery disease, has history of LVH secondary to hypertensive heart disease.  Echocardiogram in March 2023 revealed normal EF, grade 1 DD, mild LVH.  She has a longstanding history of resistant hypertension.  [facility] notes show that she had renal artery duplex performed that was negative for critical renal artery stenosis. Metanephrine and normetanephrine levels were normal. Previous Also/renin levels normal.   Last seen by Dr. Dina Rich on April 25, 2022.  She denied any chest pain.  Did note some leg edema, she was limiting her sodium intake, but continued no lower extremity swelling.  She was compliant with her medications.  SBP averaging 140s to 150s.  She was being screened for OSA by her PCP, but not had results.  Was started on losartan 25 mg daily for history of resistant hypertension and was referred to hypertension clinic.  Dr. Dina Rich felt that her leg edema was due to a combination of diastolic dysfunction and lymphedema. Was on 40 mg daily with alternative 20 mg daily of Lasix, her renal function returned back to baseline.  She was referred to lymphedema clinic. Told to follow-up in 4 months.   09/15/2022 - Saw her for 4 month follow-up. CC was elevated BP readings  recently. SBP at home running in 160s to 180s. Compliant with her meds. Admitted to stress and caffeine intake. Denies tobacco use. States she is due to change her clonidine patch this week. Denied any CP, SHOB, palpitations, syncope, presyncope, dizziness, orthopnea, PND, significant weight changes, worsening leg edema, or claudication. Reported providing care for her mom and would take her to dialysis. Stated she was told in the past she has a history of CKD stage 3. BP was elevated in office that day. Refused  ED evaluation. Increased Carvedilol from 25 mg BID to 50 mg BID, increased losartan from 25 mg daily to 50 mg daily, instructed to switch out Clonidine weekly patch that day. Called her the following day, with improved BP readings. Care precautions were discussed.   Admitted 11/2022 at [facility] with suspected CVA and hypertensive emergency.  Initial CT scan showed 2 small remote infarcts, no acute intracranial process.  MRI performed next day ruled out acute stroke, infarcts were noted to be remote, however were new compared to CT scan from 2020.  Participated with PT.  Did receive IV nitroglycerin, was weaned off.  Blood pressure improved to 139/76.  Medications adjusted. Was discharged home in stable condition.    Presented to [facility] ED on January 17, 2023 with chest pain.  Workup overall unremarkable.  EKG showed normal sinus rhythm, no acute ischemic changes.  Discharged later that day.  Today she comes in and states she is not doing well.  She updates me about  her hospital admission and recent ED visit.  Denies any active chest pain but continues to note left side numbness/weakness x 1 week.  Blood pressure is elevated today. Denies any chest pain, shortness of breath, palpitations, syncope, presyncope, dizziness, orthopnea, PND, swelling or significant weight changes, acute bleeding, or claudication.  Past Medical History:  Diagnosis Date   Chronic kidney disease (CKD)    Self  reported as stage 3   Diabetes mellitus without complication (HCC)    Hypertensive heart disease    Hypertensive urgency    Left ventricular hypertrophy    Lymphedema    Resistant Hypertension    sees Adv HTN Clinic    Past Surgical History:  Procedure Laterality Date   CESAREAN SECTION  2001   RHINOPLASTY      Current Meds  Medication Sig   acetaminophen (TYLENOL) 500 MG tablet Take 500 mg by mouth every 6 (six) hours as needed.    albuterol (PROVENTIL HFA;VENTOLIN HFA) 108 (90 Base) MCG/ACT inhaler Inhale 1 puff into the lungs every 4 (four) hours as needed for wheezing or shortness of breath.   amLODipine (NORVASC) 10 MG tablet Take 1 tablet (10 mg total) by mouth daily.   aspirin EC 81 MG tablet Take 81 mg by mouth daily.   carvedilol (COREG) 25 MG tablet Take 2 tablets (50 mg total) by mouth 2 (two) times daily.   cloNIDine (CATAPRES - DOSED IN MG/24 HR) 0.3 mg/24hr patch Place 1 patch (0.3 mg total) onto the skin once a week.   fluticasone (FLONASE) 50 MCG/ACT nasal spray Place 2 sprays into both nostrils daily as needed.   furosemide (LASIX) 40 MG tablet Take one tab (40mg ) alternating with 1/2 tab (20mg ) every other day   glimepiride (AMARYL) 2 MG tablet Take 1 tablet by mouth 2 (two) times daily.   hydrALAZINE (APRESOLINE) 100 MG tablet Take 1 tablet (100 mg total) by mouth 3 (three) times daily.   isosorbide mononitrate (IMDUR) 30 MG 24 hr tablet Take 3 tablets by mouth daily.   losartan (COZAAR) 50 MG tablet Take 1 tablet (50 mg total) by mouth daily.   metFORMIN (GLUCOPHAGE) 500 MG tablet Take 500 mg by mouth 2 (two) times daily with a meal.   Multiple Vitamins-Minerals (MULTIVITAMIN WITH MINERALS) tablet Take 1 tablet by mouth daily.   spironolactone (ALDACTONE) 50 MG tablet Take 1 tablet (50 mg total) by mouth daily.    Allergies:   Ace inhibitors, Cefoxitin, and Ceftin [cefuroxime axetil]   Social History   Socioeconomic History   Marital status: Legally  Separated    Spouse name: Not on file   Number of children: Not on file   Years of education: Not on file   Highest education level: Not on file  Occupational History   Not on file  Tobacco Use   Smoking status: Never    Passive exposure: Never   Smokeless tobacco: Never  Vaping Use   Vaping Use: Never used  Substance and Sexual Activity   Alcohol use: No   Drug use: No   Sexual activity: Not on file  Other Topics Concern   Not on file  Social History Narrative   Not on file   Social Determinants of Health   Financial Resource Strain: Not on file  Food Insecurity: Not on file  Transportation Needs: Not on file  Physical Activity: Not on file  Stress: Not on file  Social Connections: Not on file     Family History: The  patient's family history includes Diabetes in her father and mother; Heart disease in her father; Kidney disease in her mother.  ROS:   Review of Systems  Constitutional:  Positive for malaise/fatigue. Negative for chills, diaphoresis, fever and weight loss.  HENT: Negative.    Eyes: Negative.   Respiratory: Negative.    Cardiovascular: Negative.   Gastrointestinal: Negative.   Genitourinary: Negative.   Musculoskeletal: Negative.   Skin: Negative.   Neurological:  Positive for sensory change and weakness. Negative for dizziness, tingling, tremors, speech change, focal weakness, seizures, loss of consciousness and headaches.       Slight facial droop to left side. Positive left pronator drift. Decreased sensation/numbness along left side, ataxia.   Endo/Heme/Allergies: Negative.   Psychiatric/Behavioral:  Negative for depression, hallucinations, memory loss, substance abuse and suicidal ideas. The patient has insomnia. The patient is not nervous/anxious.     Please see the history of present illness.    All other systems reviewed and are negative.  EKGs/Labs/Other Studies Reviewed:    The following studies were reviewed today:   EKG:  EKG is  ordered today and demonstrates normal sinus rhythm, LVH with repolarization abnormality, 84 bpm, no acute ischemic changes.  Cardiac monitor on 03/21/2022: 14 day monitor Rare supraventricular ectopy in the form of isolated PACs, couplets, triplets. 3 runs of SVT longest 6 beats Rare ventricular ectopy in the form of isolated PVCs, couplets, triplets Patient triggered events but no reported symptoms   2D Echo on 12/06/2021:  1. Left ventricular ejection fraction, by estimation, is 60 to 65%. The  left ventricle has normal function. The left ventricle has no regional  wall motion abnormalities. There is mild left ventricular hypertrophy.  Left ventricular diastolic parameters  are consistent with Grade I diastolic dysfunction (impaired relaxation).   2. Right ventricular systolic function is normal. The right ventricular  size is normal.   3. Left atrial size was mildly dilated.   4. The mitral valve is normal in structure. No evidence of mitral valve  regurgitation. No evidence of mitral stenosis.   5. The tricuspid valve is abnormal.   6. The aortic valve is tricuspid. Aortic valve regurgitation is not  visualized. No aortic stenosis is present.   7. Aortic dilatation noted. There is mild dilatation of the ascending  aorta, measuring 37 mm.   8. The inferior vena cava is normal in size with greater than 50%  respiratory variability, suggesting right atrial pressure of 3 mmHg.   Recent Labs: 02/01/2022: BUN 21; Creatinine, Ser 1.05; Potassium 3.9; Sodium 138  Recent Lipid Panel No results found for: "CHOL", "TRIG", "HDL", "CHOLHDL", "VLDL", "LDLCALC", "LDLDIRECT"   Physical Exam:    VS:  BP (!) 160/124 (BP Location: Left Arm, Patient Position: Sitting, Cuff Size: Normal)   Pulse 93   Ht 5' 3.5" (1.613 m)   Wt 203 lb (92.1 kg)   LMP 06/16/2019   SpO2 97%   BMI 35.40 kg/m     Wt Readings from Last 3 Encounters:  01/23/23 203 lb (92.1 kg)  09/15/22 219 lb 9.6 oz (99.6 kg)   04/25/22 215 lb 9.6 oz (97.8 kg)     Vitals:   01/23/23 1145 01/23/23 1146  BP: (!) 160/110 (!) 160/124  Pulse:    SpO2:       GEN: Obese, 57 y.o. female in acute distress, appears fatigued HEENT: Normal NECK: No JVD; No carotid bruits CARDIAC: S1/S2, normal rate and regular rhythm, no murmurs, rubs, gallops; 2+ peripheral  pulses throughout, strong bilaterally RESPIRATORY:  Clear and diminished to auscultation without rales, wheezing or rhonchi  MUSCULOSKELETAL:  bilateral lymphedema, nonpitting along BLE, otherwise normal; No deformity  SKIN: Warm and dry NEUROLOGIC:  Alert and oriented x 3, ataxia, slight left-sided facial droop, clear speech, positive left pronator drift, EOMI, numbness/decree sensation along left side, decreased strength and difficulty following commands along the left side. PSYCHIATRIC: Flat affect  ASSESSMENT:    1. Stroke-like symptoms   2. Hypertensive emergency   3. Resistant hypertension   4. History of chest pain    PLAN:    In order of problems listed above:  Strokelike symptoms Hypertensive emergency, Resistant HTN  Hx of chest pain  Patient is a 57 year old female with past medical history as mentioned above.  History of hypertensive urgency and resistant hypertension.  Recently admitted to [facility] last month with suspected CVA and hypertensive emergency.  Initial CT scan showed 2 small remote infarcts, no acute intracranial process.  MRI performed next day ruled out acute stroke, infarcts were noted to be remote, however were new compared to CT scan from 2020.  Participated with PT.  Did receive IV nitroglycerin, was weaned off.  Blood pressure improved to 139/76.  Medications adjusted. Was discharged home in stable condition.  Presented back to [facility] with chest pain 1 week ago. Workup overall unremarkable.  EKG showed normal sinus rhythm, no acute ischemic changes.  Discharged later that day.  She presents today not doing well  at all.  She says ever since she was discharged, she has had left-sided numbness/weakness.  She does not appear well on exam-see PE mentioned above.  Denies any active chest pain.  Blood pressure on arrival 150/100, I repeated manual blood pressures.  BP on right arm 160/110, BP on left arm 160/124.  Performed a thorough neuro evaluation (see above), I am highly concerned that she is showing strokelike symptoms.  EMS was contacted immediately.  I gave report to paramedics.  She was immediately taken to hospital's emergency department for evaluation.   Upon arrival to the ED, I recommend the following be done/obtained:  STAT CT scan of the head to rule out acute ischemic/hemorrhagic stroke. Consult neurology and have full neuroevaluation done as soon as possible.  Recommend lab work including the following: D-dimer, CBC, CMET, magnesium, troponins, thyroid panel.   Admit for further observation.  I recommend she is admitted to stroke/neuro unit.  Please place on cardiac telemetry. Consult speech therapy, PT, OT.  Recommend adjustment of blood pressure medications per protocol.  Recommend referral to advanced hypertension clinic at discharge. 6.  Discharge when patient is in stable condition.  She should follow-up with outpatient cardiology in 1 to 2 weeks after discharge from hospital.    Medication Adjustments/Labs and Tests Ordered: Current medicines are reviewed at length with the patient today.  Concerns regarding medicines are outlined above.  Orders Placed This Encounter  Procedures   EKG 12-Lead   No orders of the defined types were placed in this encounter.   Patient Instructions  Medication Instructions:  Your physician recommends that you continue on your current medications as directed. Please refer to the Current Medication list given to you today.   Labwork: none  Testing/Procedures: none  Follow-Up:  Your physician recommends that you schedule a follow-up appointment  in: 1-2 weeks after hospital discharge  Any Other Special Instructions Will Be Listed Below (If Applicable).  If you need a refill on your cardiac medications before your  next appointment, please call your pharmacy.    Signed, Sharlene Dory, NP  01/23/2023 12:24 PM    [facility]

## 2023-01-23 NOTE — Patient Instructions (Signed)
Medication Instructions:  Your physician recommends that you continue on your current medications as directed. Please refer to the Current Medication list given to you today.   Labwork: none  Testing/Procedures: none  Follow-Up:  Your physician recommends that you schedule a follow-up appointment in: 1-2 weeks after hospital discharge  Any Other Special Instructions Will Be Listed Below (If Applicable).  If you need a refill on your cardiac medications before your next appointment, please call your pharmacy.

## 2023-02-02 ENCOUNTER — Other Ambulatory Visit: Payer: Self-pay | Admitting: Cardiology

## 2023-02-09 ENCOUNTER — Ambulatory Visit: Payer: BC Managed Care – PPO | Admitting: Nurse Practitioner

## 2023-03-05 ENCOUNTER — Ambulatory Visit: Payer: BC Managed Care – PPO

## 2023-03-05 ENCOUNTER — Ambulatory Visit: Payer: BC Managed Care – PPO | Admitting: Physician Assistant

## 2023-03-13 ENCOUNTER — Ambulatory Visit: Payer: BC Managed Care – PPO

## 2023-03-13 ENCOUNTER — Other Ambulatory Visit (INDEPENDENT_AMBULATORY_CARE_PROVIDER_SITE_OTHER): Payer: BC Managed Care – PPO

## 2023-03-13 ENCOUNTER — Other Ambulatory Visit: Payer: Self-pay | Admitting: Cardiology

## 2023-03-13 ENCOUNTER — Ambulatory Visit (INDEPENDENT_AMBULATORY_CARE_PROVIDER_SITE_OTHER): Payer: BC Managed Care – PPO | Admitting: Physician Assistant

## 2023-03-13 ENCOUNTER — Encounter: Payer: Self-pay | Admitting: Physician Assistant

## 2023-03-13 VITALS — BP 130/80 | HR 92 | Resp 20 | Ht 63.5 in | Wt 200.0 lb

## 2023-03-13 DIAGNOSIS — E782 Mixed hyperlipidemia: Secondary | ICD-10-CM

## 2023-03-13 DIAGNOSIS — I63511 Cerebral infarction due to unspecified occlusion or stenosis of right middle cerebral artery: Secondary | ICD-10-CM | POA: Diagnosis not present

## 2023-03-13 LAB — LIPID PANEL
Cholesterol: 103 mg/dL (ref 0–200)
HDL: 32.2 mg/dL — ABNORMAL LOW (ref >39.00)
LDL Cholesterol: 58 mg/dL (ref 0–99)
NonHDL: 71.16
Total CHOL/HDL Ratio: 3
Triglycerides: 66 mg/dL (ref 0.0–149.0)
VLDL: 13.2 mg/dL (ref 0.0–40.0)

## 2023-03-13 LAB — HEMOGLOBIN A1C: Hgb A1c MFr Bld: 7.2 % — ABNORMAL HIGH (ref 4.6–6.5)

## 2023-03-13 NOTE — Progress Notes (Addendum)
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   Images not available for review, however CT head unremarkable, with unchanged mild chronic small vessel disease. 6/3 MRI of the brain revealed ischemic infarct in the right dorsal pons, without acute hemorrhage or significant mass effect.  CTA of the head and neck remarkable for severe stenosis with superior division of the proximal M2 right MCA vessel, and severe stenosis within the anterior cerebral artery A2 segments bilaterally and within the right PCA P2 segment.  2D echo was negative for PFO or any other abnormalities, no cardioembolic source by Holter.  She was placed on dual antiplatelet therapy for 3 weeks now she is on aspirin alone.  Left-arm weakness and numbness have resolved.  There is minimal residual of left facial droop without speech problems, and she continues to have left foot weakness and numbness as residual.  She is receiving PT.    Recommendations   Continue to control cardiovascular risk factors and specifically her labile hypertension. Continue lipid-lowering agent, baby aspirin Obtain disk of images from [facility] Check lipid panel and A1c and follow-up with PCP Continue PT for strength and balance Continue to use CPAP for OSA Follow-up in 6 months  History of Present Illness:  [name] [name] is a 57 y.o. right-handed female with a history of hypertension, LVH, OSA on CPAP, CKD stage III, DM 2, presenting for evaluation of recent CVA with left-sided weakness, numbness and left facial droop.   Patient presented today ED with chest pain and 1 week history of left-sided numbness and weakness , stumbling and falling over things.   This was preceded by another presentation to the ED for hypertensive emergency.  In review, the patient reports that "many symptoms " started around 4/25   with "L pain in the L side of my neck ". "They said I had many mini strokes". She had several CT head with negative results.  Before 4/25 she had an episode of dizziness and vertigo ,"physically sick and ended in the ICU for 2 days" without recurrence. Denies vision changes.  Endorsed supraorbital headaches when BP is high.  Denies dysarthria or dysphagia. No confusion or seizures. She did report chest pain during the last admission, denies shortness of breath with all of the events. Denies any fever or chills, or night sweats. No tobacco. No new meds or hormonal supplements.  She denies any mood changes since the stroke.  Finished Plavix  and now is on baby ASA a day, Denies recent long distance trips or recent surgeries. No sick contacts. No new stressors present in personal life. Patient is compliant with his medications.  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 MGF died with a stroke.   Other out-side paper records, electronic medical record,  have been reviewed where available and summarized as:      EKG:  EKG is ordered today and demonstrates normal sinus rhythm, LVH with repolarization abnormality, 84 bpm, no acute ischemic changes.   Cardiac monitor on 03/21/2022: 14 day monitor Rare supraventricular ectopy in the form of isolated PACs, couplets, triplets. 3 runs of SVT longest 6 beats Rare ventricular ectopy in the form of isolated PVCs, couplets, triplets Patient triggered events  but no reported symptoms     2D Echo on 12/06/2021: With EF 60 to 65%, normal LV function, mild L BPH, grade 1 diastolic dysfunction, normal right ventricular systolic function, normal mitral valve, abnormal tricuspid, no aortic stenosis.  Mild aortic dilatation is noted in the ascending aorta measuring 37 mm, IVC is  normal.       Past Medical History:  Diagnosis Date   Chronic kidney disease (CKD)    Self reported as stage 3   Diabetes mellitus without complication (HCC)    Hypertensive heart disease    Hypertensive urgency    Left ventricular hypertrophy    Lymphedema    Resistant Hypertension    sees Adv HTN Clinic    Past Surgical History:  Procedure Laterality Date   CESAREAN SECTION  2001   RHINOPLASTY       Medications:  Outpatient Encounter Medications as of 03/13/2023  Medication Sig   acetaminophen (TYLENOL) 500 MG tablet Take 500 mg by mouth every 6 (six) hours as needed.    albuterol (PROVENTIL HFA;VENTOLIN HFA) 108 (90 Base) MCG/ACT inhaler Inhale 1 puff into the lungs every 4 (four) hours as needed for wheezing or shortness of breath.   aspirin EC 81 MG tablet Take 81 mg by mouth daily.   atorvastatin (LIPITOR) 80 MG tablet Take 1 tablet by mouth daily.   carvedilol (COREG) 25 MG tablet TAKE 2 TABLETS BY MOUTH 2 TIMES DAILY.   cloNIDine (CATAPRES - DOSED IN MG/24 HR) 0.3 mg/24hr patch Place 1 patch (0.3 mg total) onto the skin once a week.   cloNIDine (CATAPRES) 0.1 MG tablet Take 0.1 mg by mouth daily.   fluticasone (FLONASE) 50 MCG/ACT nasal spray Place 2 sprays into both nostrils daily as needed.   furosemide (LASIX) 40 MG tablet Take one tab (40mg ) alternating with 1/2 tab (20mg ) every other day   glimepiride (AMARYL) 2 MG tablet Take 1 tablet by mouth 2 (two) times daily.   hydrALAZINE (APRESOLINE) 100 MG tablet Take 1 tablet (100 mg total) by mouth 3 (three) times daily.   isosorbide mononitrate (IMDUR) 30 MG 24 hr tablet Take 3 tablets by mouth daily.   losartan (COZAAR) 50 MG tablet Take 1 tablet (50 mg total) by mouth daily.   metFORMIN (GLUCOPHAGE) 500 MG tablet Take 500 mg by mouth 2 (two) times daily with a meal.   Multiple Vitamins-Minerals (MULTIVITAMIN WITH MINERALS) tablet Take 1 tablet by mouth daily.   PENNSAID 2 % SOLN Apply 2 application  topically daily  as needed.   [DISCONTINUED] spironolactone (ALDACTONE) 50 MG tablet Take 1 tablet (50 mg total) by mouth daily.   amLODipine (NORVASC) 10 MG tablet Take 1 tablet (10 mg total) by mouth daily.   No facility-administered encounter medications on file as of 03/13/2023.    Allergies:  Allergies  Allergen Reactions   Ace Inhibitors Swelling and Shortness Of Breath   Cefoxitin Shortness Of Breath   Ceftin [Cefuroxime Axetil] Anaphylaxis    Family History: Family History  Problem Relation Age of Onset   Diabetes Mother    Kidney disease Mother    Diabetes Father    Heart disease Father     Social History: Social History   Tobacco Use   Smoking status: Never    Passive exposure: Never   Smokeless tobacco: Never  Vaping Use   Vaping Use: Never used  Substance Use Topics   Alcohol use: No   Drug use: No   Social History  Social History Narrative   Right handed   Dirnks caffeine   One story home   Not umployed at this time   One child        Vital Signs:  BP 130/80   Pulse 92   Resp 20   Ht 5' 3.5" (1.613 m)   Wt 200 lb (90.7 kg)   LMP 06/16/2019   SpO2 98%   BMI 34.87 kg/m    General Medical Exam:   General:  Well appearing, comfortable.   Eyes/ENT: see cranial nerve examination.   Neck:   No carotid bruits. Respiratory:  Clear to auscultation, good air entry bilaterally.   Cardiac:  Regular rate and rhythm, soft 1/6 murmur.   Extremities:  No deformities, edema, or skin discoloration.  Skin:  No rashes or lesions.  Neurological Exam: MENTAL STATUS including orientation to time, place, person, recent and remote memory, attention span and concentration, language, and fund of knowledge is normal.  Speech is not dysarthric.  CRANIAL NERVES: II:  No visual field defects.  Unremarkable fundi.   III-IV-VI: Pupils equal round and reactive to light.  Normal conjugate, extra-ocular eye movements in all directions of gaze.  No nystagmus.  No ptosis .   V:  Normal  facial sensation.    VII: Mild left facial droop with unremarkable movements VIII:  Normal hearing and vestibular function.   IX-X:  Normal palatal movement.   XI:  Normal shoulder shrug and head rotation.   XII:  Normal tongue strength and range of motion, no deviation or fasciculation.  MOTOR:  No atrophy, fasciculations or abnormal movements.  No pronator drift.    SENSORY:  Normal   perception of light touch, pinprick, vibration, and proprioception on the right.  On the left lower extremity there is decreased sensation below the knee and on the foot and toes.  Romberg's sign absent.   COORDINATION/GAIT: Normal finger-to- nose-finger and heel-to-shin.  Intact rapid alternating movements bilaterally.  Able to rise from a chair but needs her arms.  Gait narrow based and unstable, needs walker.  There is difficulty moving her "during ambulation.       Total time spent:  63 mins   Thank you for allowing me to participate in patient's care.  If I can answer any additional questions, I would be pleased to do so.    Sincerely,   Marlowe Kays, PA-C

## 2023-03-13 NOTE — Patient Instructions (Addendum)
Check labs lipid panel and A1C Continue baby ASA indefinitely  Continue the cholesterol pill Continue an effort to control the very abnormal blood pressure, but recommend go to the ER if BP is dangerously high Continue PT Continue using CPAP  Follow up 6 months

## 2023-03-13 NOTE — Progress Notes (Signed)
A1C still high, and  Good cholesterol is slightly on the lower side, will need to follow with PCP, will forward results

## 2023-03-14 ENCOUNTER — Ambulatory Visit (INDEPENDENT_AMBULATORY_CARE_PROVIDER_SITE_OTHER): Payer: BC Managed Care – PPO | Admitting: Vascular Surgery

## 2023-03-14 ENCOUNTER — Encounter: Payer: Self-pay | Admitting: Vascular Surgery

## 2023-03-14 VITALS — BP 155/101 | HR 83 | Temp 97.5°F | Ht 63.5 in | Wt 200.0 lb

## 2023-03-14 DIAGNOSIS — I6381 Other cerebral infarction due to occlusion or stenosis of small artery: Secondary | ICD-10-CM | POA: Diagnosis not present

## 2023-03-14 NOTE — Progress Notes (Signed)
Vascular and [facility]in Specialist of [place]  Patient name: [name] [name] MRN: [id number] DOB: 1966/08/17 Sex: female  REASON FOR CONSULT: Evaluation following right brain stroke  HPI: [name] [name] is a 57 y.o. female, who is here today for evaluation following right brain stroke.  I have her records for review from [facility].  She presented with hypertension and left-sided weakness in May 2024.  MRI revealed right MCA distribution stroke.  She underwent CT angiogram of her head and neck showing multifocal intracranial stenoses felt to be responsible for her stroke.  She does have a history of diabetes and significant hypertension.  She does not smoke.  She reports that her arm weakness has improved but she continues to have some weakness in her left leg and is walking with a walker as a consequence of this.  Past Medical History:  Diagnosis Date   Chronic kidney disease (CKD)    Self reported as stage 3   Diabetes mellitus without complication (HCC)    Hypertensive heart disease    Hypertensive urgency    Left ventricular hypertrophy    Lymphedema    Resistant Hypertension    sees Adv HTN Clinic    Family History  Problem Relation Age of Onset   Diabetes Mother    Kidney disease Mother    Diabetes Father    Heart disease Father     SOCIAL HISTORY: Social History   Socioeconomic History   Marital status: Legally Separated    Spouse name: Not on file   Number of children: 1   Years of education: 16   Highest education level: Not on file  Occupational History   Not on file  Tobacco Use   Smoking status: Never    Passive exposure: Never   Smokeless tobacco: Never  Vaping Use   Vaping Use: Never used  Substance and Sexual Activity   Alcohol use: No   Drug use: No   Sexual activity: Not on file  Other Topics Concern   Not on file  Social History Narrative   Right handed   Dirnks caffeine   One story home   Not umployed  at this time   One child       Social Determinants of Corporate investment banker Strain: Not on file  Food Insecurity: Not on file  Transportation Needs: Not on file  Physical Activity: Not on file  Stress: Not on file  Social Connections: Not on file  Intimate Partner Violence: Not on file    Allergies  Allergen Reactions   Ace Inhibitors Swelling and Shortness Of Breath   Cefoxitin Shortness Of Breath   Ceftin [Cefuroxime Axetil] Anaphylaxis    Current Outpatient Medications  Medication Sig Dispense Refill   acetaminophen (TYLENOL) 500 MG tablet Take 500 mg by mouth every 6 (six) hours as needed.      albuterol (PROVENTIL HFA;VENTOLIN HFA) 108 (90 Base) MCG/ACT inhaler Inhale 1 puff into the lungs every 4 (four) hours as needed for wheezing or shortness of breath.     amLODipine (NORVASC) 10 MG tablet Take 1 tablet (10 mg total) by mouth daily. 90 tablet 3   aspirin EC 81 MG tablet Take 81 mg by mouth daily.     atorvastatin (LIPITOR) 80 MG tablet Take 1 tablet by mouth daily.     carvedilol (COREG) 25 MG tablet TAKE 2 TABLETS BY MOUTH 2 TIMES DAILY. 360 tablet 2   cloNIDine (CATAPRES - DOSED IN MG/24  HR) 0.3 mg/24hr patch Place 1 patch (0.3 mg total) onto the skin once a week. 4 patch 12   cloNIDine (CATAPRES) 0.1 MG tablet Take 0.1 mg by mouth daily.     dapagliflozin propanediol (FARXIGA) 10 MG TABS tablet Take 1 tablet by mouth daily.     fluticasone (FLONASE) 50 MCG/ACT nasal spray Place 2 sprays into both nostrils daily as needed.     furosemide (LASIX) 40 MG tablet Take one tab (40mg ) alternating with 1/2 tab (20mg ) every other day (Patient taking differently: Take 40 mg by mouth daily as needed. Take one tab (40mg ) alternating with 1/2 tab (20mg ) every other day)     hydrALAZINE (APRESOLINE) 25 MG tablet Take 25 mg by mouth 3 (three) times daily.     isosorbide mononitrate (IMDUR) 30 MG 24 hr tablet Take 3 tablets by mouth daily.     losartan (COZAAR) 100 MG tablet  Take 100 mg by mouth daily.     Multiple Vitamins-Minerals (MULTIVITAMIN WITH MINERALS) tablet Take 1 tablet by mouth daily.     omeprazole (PRILOSEC) 40 MG capsule Take by mouth.     Semaglutide (RYBELSUS) 14 MG TABS Take 1 tablet by mouth daily.     spironolactone (ALDACTONE) 50 MG tablet TAKE 1 TABLET BY MOUTH EVERY DAY 90 tablet 2   No current facility-administered medications for this visit.    REVIEW OF SYSTEMS:  [X]  denotes positive finding, [ ]  denotes negative finding Cardiac  Comments:  Chest pain or chest pressure:    Shortness of breath upon exertion:    Short of breath when lying flat:    Irregular heart rhythm:        Vascular    Pain in calf, thigh, or hip brought on by ambulation:    Pain in feet at night that wakes you up from your sleep:     Blood clot in your veins:    Leg swelling:         Pulmonary    Oxygen at home:    Productive cough:     Wheezing:         Neurologic    Sudden weakness in arms or legs:  x   Sudden numbness in arms or legs:  x   Sudden onset of difficulty speaking or slurred speech:    Temporary loss of vision in one eye:     Problems with dizziness:         Gastrointestinal    Blood in stool:     Vomited blood:         Genitourinary    Burning when urinating:     Blood in urine:        Psychiatric    Major depression:         Hematologic    Bleeding problems:    Problems with blood clotting too easily:        Skin    Rashes or ulcers:        Constitutional    Fever or chills:      PHYSICAL EXAM: Vitals:   03/14/23 1403 03/14/23 1404  BP: (!) 166/112 (!) 155/101  Pulse: 83   Temp: (!) 97.5 F (36.4 C)   SpO2: 98%   Weight: 200 lb (90.7 kg)   Height: 5' 3.5" (1.613 m)     GENERAL: The patient is a well-nourished female, in no acute distress. The vital signs are documented above. CARDIOVASCULAR: Carotid arteries without bruits bilaterally.  2+  radial pulses bilaterally PULMONARY: There is good air exchange   MUSCULOSKELETAL: There are no major deformities or cyanosis. NEUROLOGIC: No focal weakness or paresthesias are detected. SKIN: There are no ulcers or rashes noted. PSYCHIATRIC: The patient has a normal affect.  DATA:  CT angiogram of head and neck from 01/24/2023 was reviewed.  This shows no evidence of extracranial cerebrovascular occlusive disease  MEDICAL ISSUES: I reviewed this with the patient.  Explained that there is no surgical treatment for her intracranial disease.  She did see neurology in follow-up yesterday and I have reviewed their recommendations and certainly agree with medical management.  I did explain the option of neuroradiology intervention should she fail medical management.  She will see [place] again on an as-needed basis   Larina Earthly, MD [facility] Vascular and Vein Specialists of [facility] Tel [phone] Pager [phone]  Note: Portions of this report may have been transcribed using voice recognition software.  Every effort has been made to ensure accuracy; however, inadvertent computerized transcription errors may still be present.

## 2023-03-21 ENCOUNTER — Telehealth: Payer: Self-pay

## 2023-03-21 MED ORDER — HYDRALAZINE HCL 50 MG PO TABS: 50.0000 mg | ORAL_TABLET | Freq: Three times a day (TID) | ORAL | 3 refills | Status: DC

## 2023-03-21 NOTE — Telephone Encounter (Signed)
Left a message for LPN- Mora Bellman with [facility] to call office back regarding medication changes and plan for patient.

## 2023-03-21 NOTE — Telephone Encounter (Signed)
Patient notified and verbalized understanding. Patient agreeable to increasing Hydralazine to 50 mg po TID. Patient had no questions or concerns at this time.

## 2023-03-21 NOTE — Telephone Encounter (Signed)
-----   Message from Antoine Poche, MD sent at 03/21/2023  4:47 PM EDT ----- Message about higher blood pressures, can we increase her hydralazine to 50mg  tid, update [place] on bp's next week. Messsage was from a home nurse Mora Bellman at [phone] if we could call her and update her about plan too please  Dominga Ferry MD

## 2023-03-22 NOTE — Telephone Encounter (Signed)
Left a message for [facility] to call office back regarding medication changes and plan for patient

## 2023-03-24 ENCOUNTER — Other Ambulatory Visit: Payer: Self-pay | Admitting: Cardiovascular Disease

## 2023-04-05 ENCOUNTER — Ambulatory Visit: Payer: BC Managed Care – PPO | Attending: Nurse Practitioner | Admitting: Nurse Practitioner

## 2023-04-05 VITALS — BP 138/82 | HR 91 | Ht 63.5 in | Wt 200.0 lb

## 2023-04-05 DIAGNOSIS — N1832 Chronic kidney disease, stage 3b: Secondary | ICD-10-CM

## 2023-04-05 DIAGNOSIS — G4733 Obstructive sleep apnea (adult) (pediatric): Secondary | ICD-10-CM

## 2023-04-05 DIAGNOSIS — I119 Hypertensive heart disease without heart failure: Secondary | ICD-10-CM

## 2023-04-05 DIAGNOSIS — Z79899 Other long term (current) drug therapy: Secondary | ICD-10-CM

## 2023-04-05 DIAGNOSIS — Z8673 Personal history of transient ischemic attack (TIA), and cerebral infarction without residual deficits: Secondary | ICD-10-CM | POA: Diagnosis not present

## 2023-04-05 DIAGNOSIS — I10 Essential (primary) hypertension: Secondary | ICD-10-CM | POA: Diagnosis not present

## 2023-04-05 DIAGNOSIS — I1A0 Resistant hypertension: Secondary | ICD-10-CM

## 2023-04-05 DIAGNOSIS — E669 Obesity, unspecified: Secondary | ICD-10-CM

## 2023-04-05 DIAGNOSIS — I672 Cerebral atherosclerosis: Secondary | ICD-10-CM

## 2023-04-05 MED ORDER — VALSARTAN 160 MG PO TABS: 160.0000 mg | ORAL_TABLET | Freq: Every day | ORAL | 3 refills | Status: AC

## 2023-04-05 NOTE — Progress Notes (Signed)
Cardiology Office Note:  .   Date:  04/05/2023 ID:  [name] [name], DOB [birth year], MRN [id number] PCP: Ignatius Specking, MD  [facility] Providers Cardiologist:  Dina Rich, MD    History of Present Illness: .   [name] [name] is a 57 y.o. female with a PMH of resistant HTN, hypertensive heart disease, LVH, leg edema, OSA on CPAP, CKD stage 3, past hx of CVA (12/2022), intracranial atherosclerotic disease with multifocal stenoses, and obesity, who presents today for overdue follow-up.   I last saw patient on January 23, 2023. BP 160/124 in office and presenting with stroke-like symptoms. EMS was immediately contacted and patient was transported to [facility] where she was admitted and dx with an acute ischemic infarct in right dorsal pons seen on MRI, no acute hemorrhagic stroke noted. CTA of head and neck imaging noted below. She worked with therapy and was discharged to SNF for rehab.   Today she presents for follow-up. Doing much better from when I last saw her.  States she has home health nurse who checks her BP regularly. Did state at one point she noticed her SBP was 230, HH RN contacted PCP about her clonidine, has improved recently. Denies any chest pain, shortness of breath, palpitations, syncope, presyncope, dizziness, orthopnea, PND, worsening swelling or significant weight changes, acute bleeding, or claudication. States she wore a Zio patch at discharge - report noted below. Does report some chronic weakness along her left lower foot from her stroke, says her strength has improved after working with therapy at [facility].  Studies Reviewed: Marland Kitchen    EKG Interpretation Date/Time:  Thursday April 05 2023 15:31:02 EDT Ventricular Rate:  90 PR Interval:  198 QRS Duration:  94 QT Interval:  388 QTC Calculation: 474 R Axis:   15  Text Interpretation: Normal sinus rhythm Possible Left atrial enlargement When compared with ECG of 11-Jul-2019 12:34, PREVIOUS ECG IS PRESENT Confirmed by Sharlene Dory [phone]) on 04/05/2023 4:26:26 PM    Zio monitor 02/2023 [facility]): Patient had a min HR of 49 bpm, max HR of 133 bpm, and avg HR of 72 bpm. Predominant underlying rhythm was Sinus Rhythm. 4 Supraventricular Tachycardia runs occurred, the run with the fastest interval lasting 7 beats with a max rate of 133 bpm, the longest lasting 8 beats with an avg rate of 99 bpm. Isolated SVEs were rare (<1.0%), SVE Couplets were rare (<1.0%), and no SVE Triplets were present. Isolated VEs were rare (<1.0%), VE Couplets were rare (<1.0%), and no VE Triplets were present.  Final Interpretation Rhythm was sinus with rare ectopic beats. Short SV-runs present as reported. Patient triggered events correspond to sinus rhythm. No sustained arrhythmia, high degree AV block or prolonged pauses present. Electronically signed by Dr. Rayetta Pigg Assar 02/26/23 06:29 AM (CT)  CT imaging 12/2022: IMPRESSION: CT head:  The known acute infarct within the right aspect of the pons is occult by CT.  CTA neck:  1. Common carotid, internal carotid and vertebral arteries are patent within the neck without stenosis or significant atherosclerotic disease. 2. Aortic Atherosclerosis (ICD10-I70.0).  CTA head:  1. No intracranial proximal large vessel occlusion. 2. Age advanced intracranial atherosclerotic disease with multifocal stenoses, most notably as follows. 3. Severe stenosis within a superior division proximal M2 right MCA vessel. 4. Severe stenoses within multiple mid M2 left MCA vessels. 5. Severe stenoses within the anterior cerebral artery A2 segments, bilaterally. 6. Severe stenosis within the right PCA P2 segment.   Electronically Signed  By: Jackey Loge D.O. On: 01/24/2023 11:09  Echo 12/15/2022 [facility]): Summary 1. The left ventricle is normal in size with normal wall thickness. 2. The left ventricular systolic function is normal, LVEF is visually estimated at > 55%. 3. There is grade I diastolic  dysfunction (impaired relaxation). 4. The left atrium is mildly dilated in size. 5. The right ventricle is normal in size, with normal systolic function. 6. There is no evidence of an interatrial flow communication or intrapulmonary shunt by agitated saline study.  Physical Exam:   VS:  BP 138/82   Pulse 91   Ht 5' 3.5" (1.613 m)   Wt 200 lb (90.7 kg)   LMP 06/16/2019   SpO2 99%   BMI 34.87 kg/m    Wt Readings from Last 3 Encounters:  04/05/23 200 lb (90.7 kg)  03/14/23 200 lb (90.7 kg)  03/13/23 200 lb (90.7 kg)    GEN: Obese, 57 y.o. female in no acute distress NECK: No JVD; No carotid bruits CARDIAC: S1/S2, RRR, no murmurs, rubs, gallops RESPIRATORY:  Clear to auscultation without rales, wheezing or rhonchi  ABDOMEN: Soft, non-tender, non-distended EXTREMITIES:  generalized, nonpitting edema; No deformity   ASSESSMENT AND PLAN: .    Hypertension, hypertensive heart disease, medication management BP improved from previous OV. TTE 11/2022 showed normal EF, grade 1 DD. SBP goal < 130.  - no RAS on renal artery [place] at [facility] in 2020, negative workup for pheo - previous elevated aldo/renin ration but normal aldo levels.  - Unable to tolerate Ace-inhibitors, but can tolerate ARB - Will stop losartan and switch to valsartan 160 mg daily. Continue amlodipine, carvedilol, clonidine patch and PO, hydralazine, and Imdur. Discussed to monitor BP at home at least 2 hours after medications and sitting for 5-10 minutes.  - Will obtain BMET in 1 week. Heart healthy diet and regular cardiovascular exercise encouraged.  Ischemic stroke, intracranial atherosclerotic disease S/P acute ischemic infarct in right dorsal pons seen on MRI 12/2022. CT imaging revealed age advanced intracranial atherosclerotic disease with multifocal stenoses. Zio monitor over benign. Was placed on DAPT therapy x 3 weeks, now on Aspirin monotherapy. Continues to note left foot weakness, numbness as residual symptoms per her  report. Previously seen by Neuro and reported that she was taking atorvastatin 80 mg daily, do not see this listed on medication list for today, have advised she checks when she returns home and gives our office a call to verify. If not, will re-initiate. Continue aspirin. Heart healthy diet and regular cardiovascular exercise encouraged. Encouraged her to continue to follow-up with Neuro.  OSA on CPAP Encouraged continued compliance.   Obesity Weight loss via diet and exercise encouraged. Discussed the impact being overweight would have on cardiovascular risk.  CKD stage 3b Most recent labs revealed sCr at 1.58 with eGFR at 39. Avoid nephrotoxic agents. Will obtain BMET as mentioned above in 1 week. Continue to follow-up with PCP.   Dispo: Follow-up with me or APP in 4-6 weeks with me or APP or sooner if anything changes.   Signed, Sharlene Dory, NP

## 2023-04-05 NOTE — Patient Instructions (Signed)
Medication Instructions:  Your physician has recommended you make the following change in your medication:  Stop taking Losartan  Start taking Valsartan 160 Mg daily Continue all other medications as prescribed.   Labwork: BMET in 1 week at [facility]   Testing/Procedures: None   Follow-Up: Your physician recommends that you schedule a follow-up appointment in: 4-6 weeks with Philis Nettle   Any Other Special Instructions Will Be Listed Below (If Applicable).  CHECK TO SEE IF YOU ARE TAKING ATORVASTATIN???  PLEASE CALL OR SEND [place] A MYCHART MESSAGE TO LET us KNOW!!   If you need a refill on your cardiac medications before your next appointment, please call your pharmacy.

## 2023-04-26 ENCOUNTER — Ambulatory Visit: Payer: BC Managed Care – PPO | Admitting: Nurse Practitioner

## 2023-05-07 ENCOUNTER — Ambulatory Visit: Payer: BC Managed Care – PPO | Attending: Nurse Practitioner | Admitting: Nurse Practitioner

## 2023-05-07 ENCOUNTER — Other Ambulatory Visit (HOSPITAL_COMMUNITY)
Admission: RE | Admit: 2023-05-07 | Discharge: 2023-05-07 | Disposition: A | Discharge status: Home / Self Care | Payer: BC Managed Care – PPO | Source: Ambulatory Visit | Attending: Nurse Practitioner | Admitting: Nurse Practitioner

## 2023-05-07 ENCOUNTER — Encounter: Payer: Self-pay | Admitting: Nurse Practitioner

## 2023-05-07 VITALS — BP 140/89 | HR 88 | Ht 63.0 in | Wt 206.0 lb

## 2023-05-07 DIAGNOSIS — I672 Cerebral atherosclerosis: Secondary | ICD-10-CM

## 2023-05-07 DIAGNOSIS — Z8673 Personal history of transient ischemic attack (TIA), and cerebral infarction without residual deficits: Secondary | ICD-10-CM

## 2023-05-07 DIAGNOSIS — I1A0 Resistant hypertension: Secondary | ICD-10-CM

## 2023-05-07 DIAGNOSIS — I119 Hypertensive heart disease without heart failure: Secondary | ICD-10-CM

## 2023-05-07 DIAGNOSIS — Z79899 Other long term (current) drug therapy: Secondary | ICD-10-CM

## 2023-05-07 DIAGNOSIS — N1832 Chronic kidney disease, stage 3b: Secondary | ICD-10-CM

## 2023-05-07 DIAGNOSIS — E669 Obesity, unspecified: Secondary | ICD-10-CM

## 2023-05-07 DIAGNOSIS — G4733 Obstructive sleep apnea (adult) (pediatric): Secondary | ICD-10-CM

## 2023-05-07 LAB — COMPREHENSIVE METABOLIC PANEL
ALT: 20 U/L (ref 0–44)
AST: 17 U/L (ref 15–41)
Albumin: 3.3 g/dL — ABNORMAL LOW (ref 3.5–5.0)
Alkaline Phosphatase: 108 U/L (ref 38–126)
Anion gap: 8 (ref 5–15)
BUN: 27 mg/dL — ABNORMAL HIGH (ref 6–20)
CO2: 24 mmol/L (ref 22–32)
Calcium: 9 mg/dL (ref 8.9–10.3)
Chloride: 104 mmol/L (ref 98–111)
Creatinine, Ser: 1.15 mg/dL — ABNORMAL HIGH (ref 0.44–1.00)
GFR, Estimated: 57 mL/min — ABNORMAL LOW (ref >60)
Glucose, Bld: 129 mg/dL — ABNORMAL HIGH (ref 70–99)
Potassium: 4.3 mmol/L (ref 3.5–5.1)
Sodium: 136 mmol/L (ref 135–145)
Total Bilirubin: 0.5 mg/dL (ref 0.3–1.2)
Total Protein: 7.9 g/dL (ref 6.5–8.1)

## 2023-05-07 MED ORDER — HYDRALAZINE HCL 50 MG PO TABS: 50.0000 mg | ORAL_TABLET | Freq: Three times a day (TID) | ORAL | 6 refills | Status: DC

## 2023-05-07 MED ORDER — CLONIDINE HCL 0.1 MG PO TABS: 0.1000 mg | ORAL_TABLET | Freq: Every day | ORAL | 6 refills | Status: DC

## 2023-05-07 MED ORDER — CLONIDINE 0.3 MG/24HR TD PTWK: 0.3000 mg | MEDICATED_PATCH | TRANSDERMAL | 6 refills | Status: DC

## 2023-05-07 MED ORDER — CARVEDILOL 25 MG PO TABS: 50.0000 mg | ORAL_TABLET | Freq: Two times a day (BID) | ORAL | 6 refills | Status: AC

## 2023-05-07 MED ORDER — ISOSORBIDE MONONITRATE ER 30 MG PO TB24: 30.0000 mg | ORAL_TABLET | Freq: Every day | ORAL | 6 refills | Status: DC

## 2023-05-07 NOTE — Patient Instructions (Signed)
Medication Instructions:   Resume Imdur (Isosorbide) at 30mg  daily Clonidine, Coreg, Hydralazine refilled today  Hold Atorvastatin (Lipitor) x 1-2 weeks, then call the office with update on symptoms  Continue all other medications.     Labwork:  CMET - order given  Office will contact with results via phone, letter or mychart.     Testing/Procedures:  none  Follow-Up:  6 - 8 weeks    Any Other Special Instructions Will Be Listed Below (If Applicable). BP log  Salty six   If you need a refill on your cardiac medications before your next appointment, please call your pharmacy.

## 2023-05-07 NOTE — Progress Notes (Signed)
Cardiology Office Note:  .   Date: 05/07/2023 ID:  [name] [name], DOB Feb 05, 1966, MRN [id number] PCP: Ignatius Specking, MD  [facility] Providers Cardiologist:  Dina Rich, MD    History of Present Illness: .   [name] [name] is a 57 y.o. female with a PMH of resistant HTN, hypertensive heart disease, LVH, leg edema, OSA on CPAP, CKD stage 3, past hx of CVA (12/2022), intracranial atherosclerotic disease with multifocal stenoses, and obesity, who presents today for overdue follow-up.   I last saw patient on January 23, 2023. BP 160/124 in office and presenting with stroke-like symptoms. EMS was immediately contacted and patient was transported to [facility] where she was admitted and dx with an acute ischemic infarct in right dorsal pons seen on MRI, no acute hemorrhagic stroke noted. CTA of head and neck imaging noted below. She worked with therapy and was discharged to SNF for rehab.   Today she presents for follow-up.  I was contacted by palliative care NP Lucrezia Europe) with [facility] who stated that her blood pressure recently has been better controlled since starting valsartan, had 1 BP reading reported at 170/90, was under caregiver stress at the time d/t to autistic son's HHA recently quitting. Today she presents for follow-up. Does admit to aching feeling in both of her legs (left more so than right), since on Atorvastatin.  States she is not taking Imdur recently.  Overall doing well from a cardiac perspective. Denies any chest pain, shortness of breath, palpitations, syncope, presyncope, dizziness, orthopnea, PND, swelling or significant weight changes, acute bleeding, or claudication.  Studies Reviewed: Marland Kitchen    Zio monitor 02/2023 [facility]): Patient had a min HR of 49 bpm, max HR of 133 bpm, and avg HR of 72 bpm. Predominant underlying rhythm was Sinus Rhythm. 4 Supraventricular Tachycardia runs occurred, the run with the fastest interval lasting 7 beats with a max rate  of 133 bpm, the longest lasting 8 beats with an avg rate of 99 bpm. Isolated SVEs were rare (<1.0%), SVE Couplets were rare (<1.0%), and no SVE Triplets were present. Isolated VEs were rare (<1.0%), VE Couplets were rare (<1.0%), and no VE Triplets were present.  Final Interpretation Rhythm was sinus with rare ectopic beats. Short SV-runs present as reported. Patient triggered events correspond to sinus rhythm. No sustained arrhythmia, high degree AV block or prolonged pauses present. Electronically signed by Dr. Rayetta Pigg Assar 02/26/23 06:29 AM (CT)  CT imaging 12/2022: IMPRESSION: CT head:  The known acute infarct within the right aspect of the pons is occult by CT.  CTA neck:  1. Common carotid, internal carotid and vertebral arteries are patent within the neck without stenosis or significant atherosclerotic disease. 2. Aortic Atherosclerosis (ICD10-I70.0).  CTA head:  1. No intracranial proximal large vessel occlusion. 2. Age advanced intracranial atherosclerotic disease with multifocal stenoses, most notably as follows. 3. Severe stenosis within a superior division proximal M2 right MCA vessel. 4. Severe stenoses within multiple mid M2 left MCA vessels. 5. Severe stenoses within the anterior cerebral artery A2 segments, bilaterally. 6. Severe stenosis within the right PCA P2 segment.   Electronically Signed By: Jackey Loge D.O. On: 01/24/2023 11:09  Echo 12/15/2022 [facility]): Summary 1. The left ventricle is normal in size with normal wall thickness. 2. The left ventricular systolic function is normal, LVEF is visually estimated at > 55%. 3. There is grade I diastolic dysfunction (impaired relaxation). 4. The left atrium is mildly dilated in size. 5. The  right ventricle is normal in size, with normal systolic function. 6. There is no evidence of an interatrial flow communication or intrapulmonary shunt by agitated saline study.  Physical Exam:   VS:  BP (!) 140/89 (BP  Location: Right Arm, Patient Position: Sitting, Cuff Size: Normal)   Pulse 88   Ht 5\' 3"  (1.6 m)   Wt 206 lb (93.4 kg)   LMP 06/16/2019   SpO2 95%   BMI 36.49 kg/m    Wt Readings from Last 3 Encounters:  05/07/23 206 lb (93.4 kg)  04/05/23 200 lb (90.7 kg)  03/14/23 200 lb (90.7 kg)    GEN: Obese, 57 y.o. female in no acute distress NECK: No JVD; No carotid bruits CARDIAC: S1/S2, RRR, no murmurs, rubs, gallops RESPIRATORY:  Clear to auscultation without rales, wheezing or rhonchi  ABDOMEN: Soft, non-tender, non-distended EXTREMITIES:  generalized, nonpitting edema; No deformity   ASSESSMENT AND PLAN: .    Resistant hypertension, hypertensive heart disease, medication management BP improved from previous OV. TTE 11/2022 showed normal EF, grade 1 DD. SBP goal < 130.  - no RAS on renal artery [place] at [facility] in 2020, negative workup for pheo - previous elevated aldo/renin ration but normal aldo levels.  - Unable to tolerate Ace-inhibitors, but can tolerate ARB - Continue amlodipine, carvedilol, clonidine patch and PO, valsartan, hydralazine, and will restart Imdur at 30 mg daily. Discussed to monitor BP at home at least 2 hours after medications and sitting for 5-10 minutes. Given BP log and salty six.  Will refill her other BP medications per her request. - Will obtain CMET at this time as this was not obtained after previous OV. Heart healthy diet and regular cardiovascular exercise encouraged.  Ischemic stroke, intracranial atherosclerotic disease S/P acute ischemic infarct in right dorsal pons seen on MRI 12/2022. CT imaging revealed age advanced intracranial atherosclerotic disease with multifocal stenoses. Zio monitor over benign. Was placed on DAPT therapy x 3 weeks, now on Aspirin monotherapy. Continues to note left foot weakness, numbness as residual symptoms per her report. Continue aspirin and will hold atorvastatin x 1-2 weeks d/t aching sensation in legs, she will let us know in that  timeframe if her symptoms improve. Heart healthy diet and regular cardiovascular exercise encouraged. Encouraged her to continue to follow-up with Neuro.  OSA on CPAP Encouraged continued compliance.   Obesity Weight loss via diet and exercise encouraged. Discussed the impact being overweight would have on cardiovascular risk.  CKD stage 3b Most recent labs revealed sCr at 1.58 with eGFR at 39. Avoid nephrotoxic agents. Will obtain CMET as mentioned above. Continue to follow-up with PCP.   Dispo: Will refill medications per her request. Follow-up with me or APP in 6-8 weeks with me or APP or sooner if anything changes.   Signed, Sharlene Dory, NP

## 2023-06-18 ENCOUNTER — Ambulatory Visit: Payer: BC Managed Care – PPO | Admitting: Nurse Practitioner

## 2023-07-30 ENCOUNTER — Ambulatory Visit: Payer: BC Managed Care – PPO | Admitting: Nurse Practitioner

## 2023-09-12 ENCOUNTER — Ambulatory Visit: Payer: BC Managed Care – PPO | Admitting: Physician Assistant

## 2023-10-04 ENCOUNTER — Ambulatory Visit: Payer: Medicaid Other | Admitting: Physician Assistant

## 2023-10-04 ENCOUNTER — Encounter: Payer: Self-pay | Admitting: Physician Assistant

## 2023-10-04 VITALS — BP 183/131 | HR 87 | Resp 20 | Ht 63.0 in | Wt 218.0 lb

## 2023-10-04 DIAGNOSIS — I63511 Cerebral infarction due to unspecified occlusion or stenosis of right middle cerebral artery: Secondary | ICD-10-CM | POA: Diagnosis not present

## 2023-10-04 NOTE — Progress Notes (Signed)
 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 6/3 MRI of the brain revealed ischemic infarct in the right dorsal pons, without acute hemorrhage or significant mass effect.  CTA of the head and neck remarkable for severe stenosis with superior division of the proximal M2 right MCA vessel, and severe stenosis within the anterior cerebral artery A2 segments bilaterally and within the right PCA P2 segment.  2D echo was negative for PFO or any other abnormalities, no cardioembolic source by Holter.  She was placed on dual antiplatelet therapy for 3 weeks and then on aspirin alone.  Left-arm weakness and numbness have resolved.  During our initial evaluation there was minimal residual of left facial droop without speech problems, residual left foot weakness and numbness as residual.  She received physical therapy with some relief.   Recommendations   Continue to control cardiovascular risk factors and specifically her labile hypertension.  Her BP is dangerously elevated today, she was informed of the abnormal values, she usually takes clonidine , she has instructed to do so to lowered his BP.  Follow-up with cardiology for medication management Continue lipid-lowering agent, baby aspirin Obtain disk of images from [facility] Continue PT for strength and balance Continue to use CPAP for OSA Follow-up as needed  History of Present Illness:  [name] [name] is a 58 y.o. right-handed female with a history of hypertension, LVH, OSA on CPAP, CKD stage III, DM 2, presenting for evaluation of recent CVA with left-sided  weakness, numbness and left facial droop.   Patient presented today ED with chest pain and 1 week history of left-sided numbness and weakness , stumbling and falling over things.  This was preceded by another presentation to the ED for hypertensive emergency.  In review, the patient reports that many symptoms  started around 4/25   with L pain in the L side of my neck . They said I had many mini strokes. She had several CT head with negative results.  Before 4/25 she had an episode of dizziness and vertigo ,physically sick and ended in the ICU for 2 days without recurrence. Denies vision changes.  Endorsed supraorbital headaches when BP is high.  Denies dysarthria or dysphagia. No confusion or seizures. She did report chest pain during the last admission, denies shortness of breath with all of the events. Denies any fever or chills, or night sweats. No tobacco. No new meds or hormonal supplements.  She denies any mood changes since the stroke.  Finished Plavix  and now is on baby ASA a day, Denies recent long distance trips or recent surgeries. No sick contacts. No new stressors present in personal life. Patient is compliant with his medications.  Patient is very active, works in writer as an tax adviser. MGF died with a stroke.     Update 10/03/2022  In today's visit, patient reports feeling better, with minimal residual on the left lower extremity, especially on the left foot.SABRA  He denies any new symptoms of a stroke.  She denies any chest pain or palpitation, dysarthria or dysphagia.  No confusion.  No shortness of breath.  Denies any fever or chills.  She is undergoing some stress at home, her blood pressure has been extremely elevated.  In today's visit, her BP reached 209/131 with repeat 183/131.  She has been instructed to follow-up as soon as possible with her cardiology regarding medication management.  She takes clonidine  as needed.  If symptomatic, she was instructed to  go to the ER for further evaluation.  She continues to use her CPAP for OSA.  She no longer works, she is now on disability.   EKG:  EKG is ordered today and demonstrates normal sinus rhythm, LVH with repolarization abnormality, 84 bpm, no acute ischemic changes.   Cardiac monitor on 03/21/2022: 14 day monitor Rare supraventricular ectopy in the form of isolated PACs, couplets, triplets. 3 runs of SVT longest 6 beats Rare ventricular ectopy in the form of isolated PVCs, couplets, triplets Patient triggered events but no reported symptoms     2D Echo on 12/06/2021: With EF 60 to 65%, normal LV function, mild L BPH, grade 1 diastolic dysfunction, normal right ventricular systolic function, normal mitral valve, abnormal tricuspid, no aortic stenosis.  Mild aortic dilatation is noted in the ascending aorta measuring 37 mm, IVC is normal.   TC 103, HDL 32.2, LDL 58, TG 66, VLDL 13.2.  A1c 7.2    Past Medical History:  Diagnosis Date   Chronic kidney disease (CKD)    Self reported as stage 3   Diabetes mellitus without complication (HCC)    Hypertensive heart disease    Hypertensive urgency    Left ventricular hypertrophy    Lymphedema    Resistant Hypertension    sees Adv HTN Clinic    Past Surgical History:  Procedure Laterality Date   CESAREAN SECTION  2001   RHINOPLASTY       Medications:  Outpatient Encounter Medications as of 10/04/2023  Medication Sig   acetaminophen (TYLENOL) 500 MG tablet Take 500 mg by mouth every 6 (six) hours as needed.    albuterol (PROVENTIL HFA;VENTOLIN HFA) 108 (90 Base) MCG/ACT inhaler Inhale 1 puff into the lungs every 4 (four) hours as needed for wheezing or shortness of breath.   amLODipine  (NORVASC ) 10 MG tablet Take 1 tablet (10 mg total) by mouth daily.   aspirin EC 81 MG tablet Take 81 mg by mouth daily.   atorvastatin (LIPITOR) 80 MG tablet Take 80 mg by mouth daily.   carvedilol  (COREG ) 25 MG tablet Take 2 tablets (50 mg total) by mouth 2  (two) times daily.   cloNIDine  (CATAPRES  - DOSED IN MG/24 HR) 0.3 mg/24hr patch Place 1 patch (0.3 mg total) onto the skin once a week.   cloNIDine  (CATAPRES ) 0.1 MG tablet Take 1 tablet (0.1 mg total) by mouth daily.   dapagliflozin propanediol (FARXIGA) 10 MG TABS tablet Take 1 tablet by mouth daily.   fluticasone (FLONASE) 50 MCG/ACT nasal spray Place 2 sprays into both nostrils daily as needed.   furosemide  (LASIX ) 40 MG tablet Take one tab (40mg ) alternating with 1/2 tab (20mg ) every other day (Patient taking differently: Take 40 mg by mouth daily as needed. Take one tab (40mg ) alternating with 1/2 tab (20mg ) every other day)   hydrALAZINE  (APRESOLINE ) 50 MG tablet Take 1 tablet (50 mg total) by mouth 3 (three) times daily.   isosorbide  mononitrate (IMDUR ) 30 MG 24 hr tablet Take 1 tablet (30 mg total) by mouth daily.   Multiple Vitamins-Minerals (MULTIVITAMIN WITH MINERALS) tablet Take 1 tablet by mouth daily.   omeprazole (PRILOSEC) 40 MG capsule Take  by mouth.   Semaglutide  (RYBELSUS ) 14 MG TABS Take 1 tablet by mouth daily.   spironolactone  (ALDACTONE ) 50 MG tablet TAKE 1 TABLET BY MOUTH EVERY DAY   valsartan  (DIOVAN ) 160 MG tablet Take 1 tablet (160 mg total) by mouth daily.   No facility-administered encounter medications on file as of 10/04/2023.    Allergies:  Allergies  Allergen Reactions   Ace Inhibitors Swelling and Shortness Of Breath   Cefoxitin Shortness Of Breath   Ceftin [Cefuroxime Axetil] Anaphylaxis    Family History: Family History  Problem Relation Age of Onset   Diabetes Mother    Kidney disease Mother    Diabetes Father    Heart disease Father     Social History: Social History   Tobacco Use   Smoking status: Never    Passive exposure: Never   Smokeless tobacco: Never  Vaping Use   Vaping status: Never Used  Substance Use Topics   Alcohol use: No   Drug use: No   Social History   Social History Narrative   Right handed   Dirnks caffeine    One story home   Not umployed at this time   One child        Vital Signs:  LMP 06/16/2019    General Medical Exam:   General:  Well appearing, comfortable.   Eyes/ENT: see cranial nerve examination.   Neck:   No carotid bruits. Respiratory:  Clear to auscultation, good air entry bilaterally.   Cardiac:  Regular rate and rhythm, soft 1 out of 6 systolic murmur Extremities:  No deformities, edema, or skin discoloration.  Skin:  No rashes or lesions.  Neurological Exam: MENTAL STATUS including orientation to time, place, person, recent and remote memory, attention span and concentration, language, and fund of knowledge is normal.  Speech is not dysarthric.  CRANIAL NERVES: II:  No visual field defects.  Fundi not visualized III-IV-VI: Pupils equal round and reactive to light.  Normal conjugate, extra-ocular eye movements in all directions of gaze.  No nystagmus.  No ptosis .   V:  Normal facial sensation.    VII: Mild left facial droop with unremarkable movement VIII:  Normal hearing and vestibular function.   IX-X:  Normal palatal movement.   XI:  Normal shoulder shrug and head rotation.   XII:  Normal tongue strength and range of motion, no deviation or fasciculation.  MOTOR:  No atrophy, fasciculations or abnormal movements.  No pronator drift.    SENSORY:  Normal   perception of light touch, pinprick, vibration, and proprioception on the right.  On the left lower extremity there is decreased sensation below the knee and on the foot and toes.  Romberg's sign absent.   COORDINATION/GAIT: Normal finger-to- nose-finger and heel-to-shin.  Intact rapid alternating movements bilaterally.  Able to rise from a chair but needs her arms.  Gait narrow based and stable with a cane.    Total time spent: 34 mins   Thank you for allowing me to participate in patient's care.  If I can answer any additional questions, I would be pleased to do so.    Sincerely,   Camie Sevin, PA-C

## 2023-10-04 NOTE — Patient Instructions (Signed)
  Continue baby ASA indefinitely  Continue the cholesterol pill Continue an effort to control the very abnormal blood pressure Continue PT Continue using CPAP

## 2024-02-03 ENCOUNTER — Other Ambulatory Visit: Payer: Self-pay | Admitting: Nurse Practitioner

## 2024-04-26 ENCOUNTER — Other Ambulatory Visit: Payer: Self-pay | Admitting: Cardiology

## 2024-05-02 ENCOUNTER — Other Ambulatory Visit: Payer: Self-pay | Admitting: Cardiology

## 2024-05-17 ENCOUNTER — Other Ambulatory Visit: Payer: Self-pay | Admitting: Cardiology

## 2024-07-04 ENCOUNTER — Ambulatory Visit: Attending: Nurse Practitioner | Admitting: Nurse Practitioner

## 2024-07-04 ENCOUNTER — Encounter: Payer: Self-pay | Admitting: Nurse Practitioner

## 2024-07-04 VITALS — BP 147/93 | HR 79 | Ht 63.0 in | Wt 205.0 lb

## 2024-07-04 DIAGNOSIS — E669 Obesity, unspecified: Secondary | ICD-10-CM | POA: Diagnosis present

## 2024-07-04 DIAGNOSIS — I1A0 Resistant hypertension: Secondary | ICD-10-CM | POA: Diagnosis present

## 2024-07-04 DIAGNOSIS — E875 Hyperkalemia: Secondary | ICD-10-CM | POA: Insufficient documentation

## 2024-07-04 DIAGNOSIS — N1832 Chronic kidney disease, stage 3b: Secondary | ICD-10-CM | POA: Insufficient documentation

## 2024-07-04 DIAGNOSIS — I672 Cerebral atherosclerosis: Secondary | ICD-10-CM | POA: Insufficient documentation

## 2024-07-04 DIAGNOSIS — G4733 Obstructive sleep apnea (adult) (pediatric): Secondary | ICD-10-CM | POA: Diagnosis present

## 2024-07-04 DIAGNOSIS — Z79899 Other long term (current) drug therapy: Secondary | ICD-10-CM | POA: Diagnosis present

## 2024-07-04 DIAGNOSIS — I119 Hypertensive heart disease without heart failure: Secondary | ICD-10-CM | POA: Diagnosis present

## 2024-07-04 DIAGNOSIS — E785 Hyperlipidemia, unspecified: Secondary | ICD-10-CM | POA: Insufficient documentation

## 2024-07-04 DIAGNOSIS — Z8673 Personal history of transient ischemic attack (TIA), and cerebral infarction without residual deficits: Secondary | ICD-10-CM | POA: Diagnosis present

## 2024-07-04 MED ORDER — HYDRALAZINE HCL 50 MG PO TABS: 75.0000 mg | ORAL_TABLET | Freq: Three times a day (TID) | ORAL | 6 refills | Status: DC

## 2024-07-04 NOTE — Patient Instructions (Addendum)
 Medication Instructions:   Increase Hydralazine  to 75mg  three times per day  Continue all other medications.     Labwork:  CMET, FLP - orders given today Reminder:  Nothing to eat or drink after 12 midnight prior to labs. Office will contact with results via phone, letter or mychart.     Testing/Procedures:  none  Follow-Up:  6 months   Any Other Special Instructions Will Be Listed Below (If Applicable).  BP log x 2-3 weeks   If you need a refill on your cardiac medications before your next appointment, please call your pharmacy.

## 2024-07-04 NOTE — Progress Notes (Addendum)
 Cardiology Office Note:  .   Date: 07/04/2024 ID:  [name] [name], DOB [birth year], MRN [id number] PCP: [name] Torrence CINDERELLA, MD  [facility] Providers Cardiologist:  [name] Carrier, MD    History of Present Illness: .   [name] [name] is a 58 y.o. female with a PMH of resistant HTN, hypertensive heart disease, LVH, leg edema, OSA on CPAP, CKD stage 3, past hx of CVA (12/2022), intracranial atherosclerotic disease with multifocal stenoses, and obesity, who presents today for follow-up.   I last saw patient on January 23, 2023. BP 160/124 in office and presenting with stroke-like symptoms. EMS was immediately contacted and patient was transported to [name] where she was admitted and dx with an acute ischemic infarct in right dorsal pons seen on MRI, no acute hemorrhagic stroke noted. CTA of head and neck imaging noted below. She worked with therapy and was discharged to SNF for rehab.   05/07/2023 - Today she presents for follow-up.  I was contacted by palliative care NP [name]) with [name] who stated that her blood pressure recently has been better controlled since starting valsartan , had 1 BP reading reported at 170/90, was under caregiver stress at the time d/t to autistic son's HHA recently quitting. Today she presents for follow-up. Does admit to aching feeling in both of her legs (left more so than right), since on Atorvastatin.  States she is not taking Imdur  recently.  Overall doing well from a cardiac perspective. Denies any chest pain, shortness of breath, palpitations, syncope, presyncope, dizziness, orthopnea, PND, swelling or significant weight changes, acute bleeding, or claudication.  07/04/2024 - Here for follow-up. Doing well but states SBP at home recently has been 160/110. Denies any chest pain, shortness of breath, palpitations, syncope, presyncope, dizziness, orthopnea, PND, swelling or significant weight changes, acute bleeding, or  claudication.  Studies Reviewed: SABRA    EKG: EKG Interpretation Date/Time:  Friday July 04 2024 09:10:12 EDT Ventricular Rate:  79 PR Interval:  200 QRS Duration:  98 QT Interval:  412 QTC Calculation: 472 R Axis:   36  Text Interpretation: Normal sinus rhythm Anterior infarct , age undetermined When compared with ECG of 05-Apr-2023 15:31, Nonspecific T wave abnormality no longer evident in Lateral leads Confirmed by [name] [name] [phone]) on 07/04/2024 9:26:47 AM    Zio monitor 02/2023 [name]): Patient had a min HR of 49 bpm, max HR of 133 bpm, and avg HR of 72 bpm. Predominant underlying rhythm was Sinus Rhythm. 4 Supraventricular Tachycardia runs occurred, the run with the fastest interval lasting 7 beats with a max rate of 133 bpm, the longest lasting 8 beats with an avg rate of 99 bpm. Isolated SVEs were rare (<1.0%), SVE Couplets were rare (<1.0%), and no SVE Triplets were present. Isolated VEs were rare (<1.0%), VE Couplets were rare (<1.0%), and no VE Triplets were present.  Final Interpretation Rhythm was sinus with rare ectopic beats. Short SV-runs present as reported. Patient triggered events correspond to sinus rhythm. No sustained arrhythmia, high degree AV block or prolonged pauses present. Electronically signed by Dr. Epimenio [name] 02/26/23 06:29 AM (CT)  CT imaging 12/2022: IMPRESSION: CT head:  The known acute infarct within the right aspect of the pons is occult by CT.  CTA neck:  1. Common carotid, internal carotid and vertebral arteries are patent within the neck without stenosis or significant atherosclerotic disease. 2. Aortic Atherosclerosis (ICD10-I70.0).  CTA head:  1. No intracranial proximal large vessel occlusion. 2. Age advanced  intracranial atherosclerotic disease with multifocal stenoses, most notably as follows. 3. Severe stenosis within a superior division proximal M2 right MCA vessel. 4. Severe stenoses within multiple mid M2 left MCA  vessels. 5. Severe stenoses within the anterior cerebral artery A2 segments, bilaterally. 6. Severe stenosis within the right PCA P2 segment.   Electronically Signed By: [name] [name] D.O. On: 01/24/2023 11:09  Echo 12/15/2022 [name] [name]): Summary 1. The left ventricle is normal in size with normal wall thickness. 2. The left ventricular systolic function is normal, LVEF is visually estimated at > 55%. 3. There is grade I diastolic dysfunction (impaired relaxation). 4. The left atrium is mildly dilated in size. 5. The right ventricle is normal in size, with normal systolic function. 6. There is no evidence of an interatrial flow communication or intrapulmonary shunt by agitated saline study.  Physical Exam:   VS:  BP (!) 147/93 (BP Location: Right Arm, Cuff Size: Large)   Pulse 79   Ht 5' 3 (1.6 m)   Wt 205 lb (93 kg)   LMP 06/16/2019   SpO2 97%   BMI 36.31 kg/m    Wt Readings from Last 3 Encounters:  07/04/24 205 lb (93 kg)  10/04/23 218 lb (98.9 kg)  05/07/23 206 lb (93.4 kg)    GEN: Obese, 58 y.o. female in no acute distress NECK: No JVD; No carotid bruits CARDIAC: S1/S2, RRR, no murmurs, rubs, gallops RESPIRATORY:  Clear to auscultation without rales, wheezing or rhonchi  ABDOMEN: Soft, non-tender, non-distended EXTREMITIES:  generalized, nonpitting edema; No deformity   ASSESSMENT AND PLAN: .    Resistant hypertension, hypertensive heart disease, medication management BP today 147/93, BP elevated at home SBP 160's per her report. TTE 11/2022 showed normal EF, grade 1 DD. SBP goal < 130.  - no RAS on renal artery US  at [name] in 2020, negative workup for pheo - previous elevated aldo/renin ration but normal aldo levels.  - Unable to tolerate Ace-inhibitors, but can tolerate ARB - Continue amlodipine , carvedilol , clonidine  patch and PO, valsartan , and Imdur  at 30 mg daily. Will increase Hydralazine  to 75 mg TID. Discussed to monitor BP at home at least 2 hours after  medications and sitting for 5-10 minutes. Given BP log and salty six.  She will update me on her BP readings in 2-3 weeks. Will obtain CMET in 1 week. Heart healthy diet and regular cardiovascular exercise encouraged.  Ischemic stroke, intracranial atherosclerotic disease, HLD Hx of acute ischemic infarct in right dorsal pons seen on MRI 12/2022. CT imaging revealed age advanced intracranial atherosclerotic disease with multifocal stenoses. Zio monitor over benign. Was placed on DAPT therapy x 3 weeks, now on Aspirin monotherapy. Heart healthy diet and regular cardiovascular exercise encouraged. Previous labs from 09/2023 showed LDL of 142, currently on Lipitor 80 mg daily and needs a repeat FLP - we will obtain. Encouraged her to continue to follow-up with Neuro.  OSA on CPAP Encouraged continued compliance.   Obesity Weight loss via diet and exercise encouraged. Discussed the impact being overweight would have on cardiovascular risk.  CKD stage 3b, hyperkalemia Most recent labs revealed sCr at 1.38 with eGFR at 45. Avoid nephrotoxic agents.Most recent K+ level with that labwork was 5.3. Will obtain CMET as mentioned above to also recheck K+ level. Continue to follow-up with [name].   Dispo: Will refill medications per her request. Follow-up with MD or APP in 6 months or sooner if anything changes.   Signed, Almarie Crate, NP

## 2024-07-09 ENCOUNTER — Encounter: Payer: Self-pay | Admitting: "Endocrinology

## 2024-07-09 ENCOUNTER — Ambulatory Visit: Payer: Self-pay | Admitting: "Endocrinology

## 2024-07-09 VITALS — BP 148/96 | HR 80 | Ht 63.0 in | Wt 204.8 lb

## 2024-07-09 DIAGNOSIS — I10 Essential (primary) hypertension: Secondary | ICD-10-CM | POA: Insufficient documentation

## 2024-07-09 DIAGNOSIS — E66812 Obesity, class 2: Secondary | ICD-10-CM

## 2024-07-09 DIAGNOSIS — Z6836 Body mass index (BMI) 36.0-36.9, adult: Secondary | ICD-10-CM | POA: Insufficient documentation

## 2024-07-09 DIAGNOSIS — Z794 Long term (current) use of insulin: Secondary | ICD-10-CM | POA: Diagnosis not present

## 2024-07-09 DIAGNOSIS — E782 Mixed hyperlipidemia: Secondary | ICD-10-CM | POA: Insufficient documentation

## 2024-07-09 DIAGNOSIS — E1122 Type 2 diabetes mellitus with diabetic chronic kidney disease: Secondary | ICD-10-CM

## 2024-07-09 DIAGNOSIS — N1831 Chronic kidney disease, stage 3a: Secondary | ICD-10-CM

## 2024-07-09 NOTE — Progress Notes (Signed)
 Endocrinology Consult Note       07/09/2024, 2:57 PM   Subjective:    Patient ID: [name] [name], female    DOB: July 02, 1966.  [name] [name] is being seen in consultation for management of currently uncontrolled symptomatic diabetes requested by  Marlee Lynwood NOVAK, MD.   Past Medical History:  Diagnosis Date   Chronic kidney disease (CKD)    Self reported as stage 3   Diabetes mellitus without complication (HCC)    Hypertensive heart disease    Hypertensive urgency    Left ventricular hypertrophy    Lymphedema    Resistant Hypertension    sees Adv [facility] Clinic    Past Surgical History:  Procedure Laterality Date   CESAREAN SECTION  2001   RHINOPLASTY      Social History   Socioeconomic History   Marital status: Legally Separated    Spouse name: Not on file   Number of children: 1   Years of education: 16   Highest education level: Not on file  Occupational History   Not on file  Tobacco Use   Smoking status: Never    Passive exposure: Never   Smokeless tobacco: Never  Vaping Use   Vaping status: Never Used  Substance and Sexual Activity   Alcohol use: No   Drug use: No   Sexual activity: Not on file  Other Topics Concern   Not on file  Social History Narrative   Right handed   Dirnks caffeine   One story home   Not umployed at this time   One child       Social Drivers of Corporate investment banker Strain: Low Risk  (01/23/2023)   Received from [facility]   Overall Financial Resource Strain (CARDIA)    Difficulty of Paying Living Expenses: Not hard at all  Food Insecurity: No Food Insecurity (10/09/2023)   Received from [facility]   Hunger Vital Sign    Within the past 12 months, you worried that your food would run out before you got the money to buy more.: Never true    Within the past 12 months, the food you bought just didn't last and you didn't have  money to get more.: Never true  Transportation Needs: No Transportation Needs (10/09/2023)   Received from [facility]   PRAPARE - Transportation    Lack of Transportation (Medical): No    Lack of Transportation (Non-Medical): No  Physical Activity: Sufficiently Active (01/23/2023)   Received from [facility]   Exercise Vital Sign    On average, how many days per week do you engage in moderate to strenuous exercise (like a brisk walk)?: 7 days    On average, how many minutes do you engage in exercise at this level?: 60 min  Stress: No Stress Concern Present (10/09/2023)   Received from [facility] - Occupational Stress Questionnaire    Feeling of Stress : Not at all  Social Connections: Moderately Integrated (01/23/2023)   Received from [facility]   Social Connection and Isolation  Panel    In a typical week, how many times do you talk on the phone with family, friends, or neighbors?: More than three times a week    How often do you get together with friends or relatives?: More than three times a week    How often do you attend church or religious services?: More than 4 times per year    Do you belong to any clubs or organizations such as church groups, unions, fraternal or athletic groups, or school groups?: Yes    How often do you attend meetings of the clubs or organizations you belong to?: More than 4 times per year    Are you married, widowed, divorced, separated, never married, or living with a partner?: Separated    Family History  Problem Relation Age of Onset   Diabetes Mother    Kidney disease Mother    Diabetes Father    Heart disease Father     Outpatient Encounter Medications as of 07/09/2024  Medication Sig   furosemide  (LASIX ) 40 MG tablet Take one tab (40mg ) alternating with 1/2 tab (20mg ) every other day (Patient taking differently: Take 40 mg by mouth daily as needed. Take one tab (40mg ) alternating with 1/2  tab (20mg ) every other day)   Insulin Glargine (BASAGLAR KWIKPEN) 100 UNIT/ML Inject 40 Units into the skin at bedtime.   Semaglutide,0.25 or 0.5MG /DOS, (OZEMPIC, 0.25 OR 0.5 MG/DOSE,) 2 MG/1.5ML SOPN Inject 0.5 mg into the skin once a week.   acetaminophen (TYLENOL) 500 MG tablet Take 500 mg by mouth every 6 (six) hours as needed.    albuterol (PROVENTIL HFA;VENTOLIN HFA) 108 (90 Base) MCG/ACT inhaler Inhale 1 puff into the lungs every 4 (four) hours as needed for wheezing or shortness of breath.   amLODipine  (NORVASC ) 10 MG tablet Take 1 tablet (10 mg total) by mouth daily.   aspirin EC 81 MG tablet Take 81 mg by mouth daily.   atorvastatin (LIPITOR) 80 MG tablet Take 80 mg by mouth daily.   carvedilol  (COREG ) 25 MG tablet Take 2 tablets (50 mg total) by mouth 2 (two) times daily.   cloNIDine  (CATAPRES  - DOSED IN MG/24 HR) 0.3 mg/24hr patch PLACE 1 PATCH (0.3 MG TOTAL) ONTO THE SKIN ONCE A WEEK.   fluticasone (FLONASE) 50 MCG/ACT nasal spray Place 2 sprays into both nostrils daily as needed.   hydrALAZINE  (APRESOLINE ) 50 MG tablet Take 1.5 tablets (75 mg total) by mouth 3 (three) times daily.   isosorbide  mononitrate (IMDUR ) 30 MG 24 hr tablet TAKE 1 TABLET BY MOUTH EVERY DAY   Multiple Vitamins-Minerals (MULTIVITAMIN WITH MINERALS) tablet Take 1 tablet by mouth daily.   omeprazole (PRILOSEC) 40 MG capsule Take by mouth. (Patient not taking: Reported on 07/09/2024)   spironolactone  (ALDACTONE ) 50 MG tablet TAKE 1 TABLET BY MOUTH EVERY DAY   valsartan  (DIOVAN ) 160 MG tablet Take 1 tablet (160 mg total) by mouth daily.   [DISCONTINUED] dapagliflozin propanediol (FARXIGA) 10 MG TABS tablet Take 1 tablet by mouth daily. (Patient not taking: Reported on 07/04/2024)   [DISCONTINUED] Semaglutide (RYBELSUS) 14 MG TABS Take 1 tablet by mouth daily. (Patient not taking: Reported on 07/09/2024)   No facility-administered encounter medications on file as of 07/09/2024.    ALLERGIES: Allergies   Allergen Reactions   Ace Inhibitors Swelling and Shortness Of Breath   Cefoxitin Shortness Of Breath   Ceftin [Cefuroxime Axetil] Anaphylaxis   Beta Adrenergic Blockers Swelling    Lips swelled    VACCINATION STATUS:  There is no immunization history on file for this patient.  Diabetes She presents for her initial diabetic visit. She has type 2 diabetes mellitus. Onset time: She was diagnosed at approximate age of 52 years. There are no hypoglycemic associated symptoms. Pertinent negatives for hypoglycemia include no confusion, headaches, pallor or seizures. Associated symptoms include blurred vision, fatigue, polydipsia and polyuria. Pertinent negatives for diabetes include no chest pain and no polyphagia. There are no hypoglycemic complications. Symptoms are worsening. Diabetic complications include a CVA and nephropathy. (Obesity, CVA) Risk factors for coronary artery disease include dyslipidemia, diabetes mellitus, family history, obesity, hypertension, post-menopausal and sedentary lifestyle. Current diabetic treatments: She is on Basaglar 20 units nightly and Ozempic 0.5 units weekly. Her weight is fluctuating minimally. She is following a generally unhealthy diet. When asked about meal planning, she reported none. She has not had a previous visit with a dietitian. She never participates in exercise. Her home blood glucose trend is fluctuating minimally. Her breakfast blood glucose range is generally >200 mg/dl. Her lunch blood glucose range is generally >200 mg/dl. Her dinner blood glucose range is generally >200 mg/dl. Her bedtime blood glucose range is generally >200 mg/dl. Her overall blood glucose range is >200 mg/dl. (She says that her blood glucose profile is actually improving.  Her current AGP shows 3% time in range, 54% level 1 hyperglycemia, 43% level 2 hyperglycemia.  Her glucose variability is 17.1%.  She has no hypoglycemia.  Her recent A1c was 13.2% May 04, 2024.) An ACE  inhibitor/angiotensin II receptor blocker is being taken.  Hypertension This is a chronic problem. The current episode started more than 1 year ago. The problem is uncontrolled. Associated symptoms include blurred vision. Pertinent negatives include no chest pain, headaches, palpitations or shortness of breath. Risk factors for coronary artery disease include dyslipidemia, diabetes mellitus, obesity, family history, post-menopausal state and sedentary lifestyle. Treatments tried: Patient is on unusual medication regimen for hypertension including Norvasc  10 mg p.o. daily, Coreg  50 mg p.o. twice daily, Catapres  0.3 mg patch w PRN Lasix , hydralazine  75 mg p.o. 3 times/d, Imdu qd, spironolactone  50 mg p.o. qd, Diovan  160 mg daily. Hypertensive end-organ damage includes kidney disease and CVA. Identifiable causes of hypertension include chronic renal disease.  Hyperlipidemia This is a chronic problem. The problem is controlled. Exacerbating diseases include chronic renal disease, diabetes and obesity. Pertinent negatives include no chest pain, myalgias or shortness of breath. Risk factors for coronary artery disease include dyslipidemia, diabetes mellitus, hypertension, family history, obesity, a sedentary lifestyle and post-menopausal.    Review of Systems  Constitutional:  Positive for fatigue. Negative for chills, fever and unexpected weight change.  HENT:  Negative for trouble swallowing and voice change.   Eyes:  Positive for blurred vision. Negative for visual disturbance.  Respiratory:  Negative for cough, shortness of breath and wheezing.   Cardiovascular:  Negative for chest pain, palpitations and leg swelling.  Gastrointestinal:  Negative for diarrhea, nausea and vomiting.  Endocrine: Positive for polydipsia and polyuria. Negative for cold intolerance, heat intolerance and polyphagia.  Musculoskeletal:  Negative for arthralgias and myalgias.  Skin:  Negative for color change, pallor, rash and  wound.  Neurological:  Negative for seizures and headaches.  Psychiatric/Behavioral:  Negative for confusion and suicidal ideas.     Objective:       07/09/2024    1:25 PM 07/04/2024    9:00 AM 10/04/2023    3:55 PM  Vitals with BMI  Height 5' 3 5' 3  Weight 204 lbs 13 oz 205 lbs   BMI 36.29 36.32   Systolic 148 147 816  Diastolic 96 93 131  Pulse 80 79     BP (!) 148/96   Pulse 80   Ht 5' 3 (1.6 m)   Wt 204 lb 12.8 oz (92.9 kg)   LMP 06/16/2019   BMI 36.28 kg/m   Wt Readings from Last 3 Encounters:  07/09/24 204 lb 12.8 oz (92.9 kg)  07/04/24 205 lb (93 kg)  10/04/23 218 lb (98.9 kg)     Physical Exam Constitutional:      Appearance: She is well-developed.  HENT:     Head: Normocephalic and atraumatic.  Neck:     Thyroid : No thyromegaly.     Trachea: No tracheal deviation.  Cardiovascular:     Rate and Rhythm: Normal rate and regular rhythm.  Pulmonary:     Effort: Pulmonary effort is normal.     Breath sounds: Normal breath sounds.  Abdominal:     General: Bowel sounds are normal.     Palpations: Abdomen is soft.     Tenderness: There is no abdominal tenderness. There is no guarding.  Musculoskeletal:        General: Normal range of motion.     Cervical back: Normal range of motion and neck supple.  Skin:    General: Skin is warm and dry.     Coloration: Skin is not pale.     Findings: No erythema or rash.  Neurological:     Mental Status: She is alert and oriented to person, place, and time.     Cranial Nerves: No cranial nerve deficit.     Coordination: Coordination normal.     Deep Tendon Reflexes: Reflexes are normal and symmetric.  Psychiatric:        Judgment: Judgment normal.       CMP ( most recent) CMP     Component Value Date/Time   NA 136 05/07/2023 1305   K 4.3 05/07/2023 1305   CL 104 05/07/2023 1305   CO2 24 05/07/2023 1305   GLUCOSE 129 (H) 05/07/2023 1305   BUN 27 (H) 05/07/2023 1305   CREATININE 1.15 (H) 05/07/2023  1305   CALCIUM 9.0 05/07/2023 1305   PROT 7.9 05/07/2023 1305   ALBUMIN 3.3 (L) 05/07/2023 1305   AST 17 05/07/2023 1305   ALT 20 05/07/2023 1305   ALKPHOS 108 05/07/2023 1305   BILITOT 0.5 05/07/2023 1305   GFRNONAA 57 (L) 05/07/2023 1305     Diabetic Labs (most recent): Lab Results  Component Value Date   HGBA1C 7.2 (H) 03/13/2023     Lipid Panel ( most recent) Lipid Panel     Component Value Date/Time   CHOL 103 03/13/2023 0856   TRIG 66.0 03/13/2023 0856   HDL 32.20 (L) 03/13/2023 0856   CHOLHDL 3 03/13/2023 0856   VLDL 13.2 03/13/2023 0856   LDLCALC 58 03/13/2023 0856      Lab Results  Component Value Date   TSH 2.100 05/07/2023      Assessment & Plan:   1. Type 2 diabetes mellitus with stage 3a chronic kidney disease, with long-term current use of insulin (HCC) (Primary)  - JARELIS EHLERT has currently uncontrolled symptomatic type 2 DM since  58 years of age,  with most recent A1c of 13.2 %. Recent labs reviewed. - I had a long discussion with her about the possible risk factors and  the pathology behind diabetes and its  complications. -her diabetes is complicated by CVA, obesity/sedentary life, polypharmacy, comorbid hypertension and hyperlipidemia and she remains at a high risk for more acute and chronic complications which include CAD, CVA, CKD, retinopathy, and neuropathy. These are all discussed in detail with her.  - I discussed all available options of managing her diabetes including de-escalation of medications.    Patient is encouraged to switch to  unprocessed or minimally processed  complex starch, adequate protein intake (mainly plant source), minimal liquid fat, plenty of fruits, and vegetables. -  she is advised to stick to a routine mealtimes to eat 3 complete meals a day and snack only when necessary (to snack only to correct hypoglycemia BG <70 day time or <100 at night).   - she acknowledges that there is a room for improvement in her food  and drink choices. - Further Specific Suggestion is made for her to avoid simple carbohydrates  from her diet including Cakes, Sweet Desserts, Ice Cream, Soda (diet and regular), Sweet Tea, Candies, Chips, Cookies, Store Bought Juices, Alcohol ,  Artificial Sweeteners,  Coffee Creamer, and Sugar-free Products. This will help patient to have more stable blood glucose profile and potentially avoid unintended weight gain.   - she will be scheduled with Penny Crumpton, RDN, CDE for individualized diabetes education.  - I have approached her with the following individualized plan to manage  her diabetes and patient agrees:   - Considering her current and prevailing glycemic burden, she will be continued on insulin treatment.  I advised her to increase her Basaglar to 40 units nightly this patient may need prandial insulin depending on her engagement.    In the meantime, she is advised to continue Ozempic 0.5 mg [place] weekly.  Her insurance did not provide coverage for [place].   Patient is not a candidate for metformin at this time.  - she is encouraged to call clinic for blood glucose levels less than 70 or above 200 mg per DL weekly average.   - she will be considered for incretin therapy as appropriate next visit.  - Specific targets for  A1c;  LDL, HDL,  and Triglycerides were discussed with the patient.  2) Blood Pressure /Hypertension:  her blood pressure is un controlled to target.   There is question of compliance with 7-8 medications for blood pressure in this patient.  she is advised to continue her current medications including valsartan  160 mg p.o. daily with breakfast, as well as Aldactone  50 mg p.o. once daily .  she is advised to bring all of her medications for reconciliation next visit.  If noncompliance is ruled out, she will be considered for workup for secondary cause of hypertension.  3) Lipids/Hyperlipidemia:   Review of her recent lipid panel showed  controlled   LDL at 58 .  she  is advised to continue    Lipitor 80 mg daily at bedtime.  Side effects and precautions discussed with her.  4)  Weight/Diet:  Body mass index is 36.28 kg/m.  -   clearly complicating her diabetes care.   she is  a candidate for weight loss. I discussed with her the fact that loss of 5 - 10% of her  current body weight will have the most impact on her diabetes management.  The above detailed  ACLM recommendations for nutrition, exercise, sleep, social life, avoidance of risky substances, the need for restorative sleep  information is also detailed on discharge instructions.  5) Chronic Care/Health Maintenance:  -  she  is on ACEI/ARB and Statin medications and  is encouraged to initiate and continue to follow up with Ophthalmology, Dentist,  Podiatrist at least yearly or according to recommendations, and advised to   stay away from smoking. I have recommended yearly flu vaccine and pneumonia vaccine at least every 5 years; moderate intensity exercise for up to 150 minutes weekly; and  sleep for 7- 9 hours a day.  - she is  advised to maintain close follow up with Marlee Lynwood NOVAK, MD for primary care needs, as well as her other providers for optimal and coordinated care.   Thank you for involving me in the care of this pleasant patient.  I spent  63  minutes in the care of the patient today including review of labs from CMP, Lipids, Thyroid  Function, Hematology (current and previous including abstractions from other facilities); face-to-face time discussing  her blood glucose readings/logs, discussing hypoglycemia and hyperglycemia episodes and symptoms, medications doses, her options of short and long term treatment based on the latest standards of care / guidelines;  discussion about incorporating lifestyle medicine;  and documenting the encounter. Risk reduction counseling performed per USPSTF guidelines to reduce  obesity and cardiovascular risk factors.      Please refer to  Patient Instructions for Blood Glucose Monitoring and Insulin/Medications Dosing Guide  in media tab for additional information. Please  also refer to  Patient Self Inventory in the Media  tab for reviewed elements of pertinent patient history.  Verneita CINDERELLA Pinal participated in the discussions, expressed understanding, and voiced agreement with the above plans.  All questions were answered to her satisfaction. she is encouraged to contact clinic should she have any questions or concerns prior to her return visit.   Follow up plan: - Return in about 1 week (around 07/16/2024) for F/U with Meter/CGM Jonnie Only - no Labs.  Ranny Earl, MD [facility] [address] [place], [place] [address] Phone: [phone]  Fax: [phone]    07/09/2024, 2:57 PM  This note was partially dictated with voice recognition software. Similar sounding words can be transcribed inadequately or may not  be corrected upon review.

## 2024-07-09 NOTE — Patient Instructions (Signed)

## 2024-07-13 ENCOUNTER — Other Ambulatory Visit: Payer: Self-pay | Admitting: Cardiology

## 2024-07-16 ENCOUNTER — Other Ambulatory Visit: Payer: Self-pay | Admitting: Nurse Practitioner

## 2024-07-16 ENCOUNTER — Other Ambulatory Visit: Payer: Self-pay | Admitting: Cardiology

## 2024-07-17 ENCOUNTER — Encounter: Payer: Self-pay | Admitting: "Endocrinology

## 2024-07-17 ENCOUNTER — Ambulatory Visit: Admitting: "Endocrinology

## 2024-07-17 VITALS — BP 114/76 | HR 76 | Ht 63.0 in | Wt 206.8 lb

## 2024-07-17 DIAGNOSIS — Z794 Long term (current) use of insulin: Secondary | ICD-10-CM

## 2024-07-17 DIAGNOSIS — I10 Essential (primary) hypertension: Secondary | ICD-10-CM | POA: Diagnosis not present

## 2024-07-17 DIAGNOSIS — Z6836 Body mass index (BMI) 36.0-36.9, adult: Secondary | ICD-10-CM

## 2024-07-17 DIAGNOSIS — E66812 Obesity, class 2: Secondary | ICD-10-CM

## 2024-07-17 DIAGNOSIS — E1122 Type 2 diabetes mellitus with diabetic chronic kidney disease: Secondary | ICD-10-CM | POA: Diagnosis not present

## 2024-07-17 DIAGNOSIS — E782 Mixed hyperlipidemia: Secondary | ICD-10-CM | POA: Diagnosis not present

## 2024-07-17 DIAGNOSIS — N1831 Chronic kidney disease, stage 3a: Secondary | ICD-10-CM | POA: Diagnosis not present

## 2024-07-17 MED ORDER — SEMAGLUTIDE (1 MG/DOSE) 4 MG/3ML ~~LOC~~ SOPN: 1.0000 mg | PEN_INJECTOR | SUBCUTANEOUS | 3 refills | Status: AC

## 2024-07-17 NOTE — Progress Notes (Signed)
 07/17/2024, 6:00 PM  Endocrinology follow-up note   Subjective:    Patient ID: [name] [name], female    DOB: October 30, 1965.  [name] [name] is being seen in follow-up after she was seen in consultation for management of currently uncontrolled symptomatic diabetes requested by  Marlee Lynwood NOVAK, MD.   Past Medical History:  Diagnosis Date   Chronic kidney disease (CKD)    Self reported as stage 3   Diabetes mellitus without complication (HCC)    Hypertensive heart disease    Hypertensive urgency    Left ventricular hypertrophy    Lymphedema    Resistant Hypertension    [facility]    Past Surgical History:  Procedure Laterality Date   CESAREAN SECTION  2001   RHINOPLASTY      Social History   Socioeconomic History   Marital status: Legally Separated    Spouse name: Not on file   Number of children: 1   Years of education: 16   Highest education level: Not on file  Occupational History   Not on file  Tobacco Use   Smoking status: Never    Passive exposure: Never   Smokeless tobacco: Never  Vaping Use   Vaping status: Never Used  Substance and Sexual Activity   Alcohol use: No   Drug use: No   Sexual activity: Not on file  Other Topics Concern   Not on file  Social History Narrative   Right handed   Dirnks caffeine   One story home   Not umployed at this time   One child       Social Drivers of Corporate investment banker Strain: Low Risk  (01/23/2023)   Received from [facility]   Overall Financial Resource Strain (CARDIA)    Difficulty of Paying Living Expenses: Not hard at all  Food Insecurity: No Food Insecurity (10/09/2023)   Received from [facility]   Hunger Vital Sign    Within the past 12 months, you worried that your food would run out before you got the money to buy more.: Never true    Within the past 12 months, the food you bought just didn't  last and you didn't have money to get more.: Never true  Transportation Needs: No Transportation Needs (10/09/2023)   Received from [facility]   PRAPARE - Transportation    Lack of Transportation (Medical): No    Lack of Transportation (Non-Medical): No  Physical Activity: Sufficiently Active (01/23/2023)   Received from [facility]   Exercise Vital Sign    On average, how many days per week do you engage in moderate to strenuous exercise (like a brisk walk)?: 7 days    On average, how many minutes do you engage in exercise at this level?: 60 min  Stress: No Stress Concern Present (10/09/2023)   Received from [facility] - Occupational Stress Questionnaire    Feeling of Stress : Not at all  Social Connections: Moderately Integrated (01/23/2023)   Received from [facility]   Social Connection and Isolation Panel  In a typical week, how many times do you talk on the phone with family, friends, or neighbors?: More than three times a week    How often do you get together with friends or relatives?: More than three times a week    How often do you attend church or religious services?: More than 4 times per year    Do you belong to any clubs or organizations such as church groups, unions, fraternal or athletic groups, or school groups?: Yes    How often do you attend meetings of the clubs or organizations you belong to?: More than 4 times per year    Are you married, widowed, divorced, separated, never married, or living with a partner?: Separated    Family History  Problem Relation Age of Onset   Diabetes Mother    Kidney disease Mother    Diabetes Father    Heart disease Father     Outpatient Encounter Medications as of 07/17/2024  Medication Sig   Semaglutide, 1 MG/DOSE, 4 MG/3ML SOPN Inject 1 mg as directed once a week.   acetaminophen (TYLENOL) 500 MG tablet Take 500 mg by mouth every 6 (six) hours as needed.    albuterol  (PROVENTIL HFA;VENTOLIN HFA) 108 (90 Base) MCG/ACT inhaler Inhale 1 puff into the lungs every 4 (four) hours as needed for wheezing or shortness of breath.   amLODipine  (NORVASC ) 10 MG tablet Take 1 tablet (10 mg total) by mouth daily.   aspirin EC 81 MG tablet Take 81 mg by mouth daily.   atorvastatin (LIPITOR) 80 MG tablet Take 80 mg by mouth daily.   carvedilol  (COREG ) 25 MG tablet Take 2 tablets (50 mg total) by mouth 2 (two) times daily.   cloNIDine  (CATAPRES  - DOSED IN MG/24 HR) 0.3 mg/24hr patch PLACE 1 PATCH (0.3 MG TOTAL) ONTO THE SKIN ONCE A WEEK.   fluticasone (FLONASE) 50 MCG/ACT nasal spray Place 2 sprays into both nostrils daily as needed.   furosemide  (LASIX ) 40 MG tablet Take one tab (40mg ) alternating with 1/2 tab (20mg ) every other day (Patient taking differently: Take 40 mg by mouth daily as needed. Take one tab (40mg ) alternating with 1/2 tab (20mg ) every other day)   hydrALAZINE  (APRESOLINE ) 50 MG tablet Take 1.5 tablets (75 mg total) by mouth 3 (three) times daily.   Insulin Glargine (BASAGLAR KWIKPEN) 100 UNIT/ML Inject 60 Units into the skin at bedtime.   isosorbide  mononitrate (IMDUR ) 30 MG 24 hr tablet TAKE 1 TABLET BY MOUTH EVERY DAY   Multiple Vitamins-Minerals (MULTIVITAMIN WITH MINERALS) tablet Take 1 tablet by mouth daily.   omeprazole (PRILOSEC) 40 MG capsule Take by mouth. (Patient not taking: Reported on 07/09/2024)   spironolactone  (ALDACTONE ) 50 MG tablet TAKE 1 TABLET BY MOUTH EVERY DAY   valsartan  (DIOVAN ) 160 MG tablet Take 1 tablet (160 mg total) by mouth daily.   [DISCONTINUED] Semaglutide,0.25 or 0.5MG /DOS, (OZEMPIC, 0.25 OR 0.5 MG/DOSE,) 2 MG/1.5ML SOPN Inject 0.5 mg into the skin once a week.   No facility-administered encounter medications on file as of 07/17/2024.    ALLERGIES: Allergies  Allergen Reactions   Ace Inhibitors Swelling and Shortness Of Breath   Cefoxitin Shortness Of Breath   Ceftin [Cefuroxime Axetil] Anaphylaxis   Beta Adrenergic  Blockers Swelling    Lips swelled    VACCINATION STATUS:  There is no immunization history on file for this patient.  Diabetes She presents for her follow-up diabetic visit. She has type 2 diabetes mellitus. Onset time: She  was diagnosed at approximate age of 52 years. Her disease course has been improving. There are no hypoglycemic associated symptoms. Pertinent negatives for hypoglycemia include no confusion, headaches, pallor or seizures. Associated symptoms include blurred vision, fatigue, polydipsia and polyuria. Pertinent negatives for diabetes include no chest pain and no polyphagia. There are no hypoglycemic complications. Symptoms are improving. Diabetic complications include a CVA and nephropathy. (Obesity, CVA) Risk factors for coronary artery disease include dyslipidemia, diabetes mellitus, family history, obesity, hypertension, post-menopausal and sedentary lifestyle. Current diabetic treatment includes insulin injections. Her weight is fluctuating minimally. She is following a generally unhealthy diet. When asked about meal planning, she reported none. She has not had a previous visit with a dietitian. She never participates in exercise. Her home blood glucose trend is decreasing steadily. Her overall blood glucose range is >200 mg/dl. (She presents with her CGM which she did not use consistently showing 4% time in range, 24% level 1 hyperglycemia, 72% level 2 hyperglycemia.  This presentation is worse than her last presentation.  Her recent A1c was 13.2%.  Relevant, patient's documents near target glycemic profile on a piece of paper she brought with her showing a completely different profile.  These are from fingerstick glucose monitoring average blood glucose between 150-182 fasting, 129 up to 200 postprandial.  His presentation is confusing, suspicious and discordant. ) An ACE inhibitor/angiotensin II receptor blocker is being taken.  Hypertension This is a chronic problem. The current  episode started more than 1 year ago. The problem is uncontrolled. Associated symptoms include blurred vision. Pertinent negatives include no chest pain, headaches, palpitations or shortness of breath. Risk factors for coronary artery disease include dyslipidemia, diabetes mellitus, obesity, family history, post-menopausal state and sedentary lifestyle. Treatments tried: Patient is on unusual medication regimen for hypertension including Norvasc  10 mg p.o. daily, Coreg  50 mg p.o. twice daily, Catapres  0.3 mg patch w PRN Lasix , hydralazine  75 mg p.o. 3 times/d, Imdu qd, spironolactone  50 mg p.o. qd, Diovan  160 mg daily. Hypertensive end-organ damage includes kidney disease and CVA. Identifiable causes of hypertension include chronic renal disease.  Hyperlipidemia This is a chronic problem. The problem is controlled. Exacerbating diseases include chronic renal disease, diabetes and obesity. Pertinent negatives include no chest pain, myalgias or shortness of breath. Risk factors for coronary artery disease include dyslipidemia, diabetes mellitus, hypertension, family history, obesity, a sedentary lifestyle and post-menopausal.      Objective:       07/17/2024    1:37 PM 07/09/2024    1:25 PM 07/04/2024    9:00 AM  Vitals with BMI  Height 5' 3 5' 3 5' 3  Weight 206 lbs 13 oz 204 lbs 13 oz 205 lbs  BMI 36.64 36.29 36.32  Systolic 114 148 852  Diastolic 76 96 93  Pulse 76 80 79    BP 114/76   Pulse 76   Ht 5' 3 (1.6 m)   Wt 206 lb 12.8 oz (93.8 kg)   LMP 06/16/2019   BMI 36.63 kg/m   Wt Readings from Last 3 Encounters:  07/17/24 206 lb 12.8 oz (93.8 kg)  07/09/24 204 lb 12.8 oz (92.9 kg)  07/04/24 205 lb (93 kg)      CMP ( most recent) CMP     Component Value Date/Time   NA 136 05/07/2023 1305   K 4.3 05/07/2023 1305   CL 104 05/07/2023 1305   CO2 24 05/07/2023 1305   GLUCOSE 129 (H) 05/07/2023 1305   BUN 27 (H) 05/07/2023  1305   CREATININE 1.15 (H) 05/07/2023 1305    CALCIUM 9.0 05/07/2023 1305   PROT 7.9 05/07/2023 1305   ALBUMIN 3.3 (L) 05/07/2023 1305   AST 17 05/07/2023 1305   ALT 20 05/07/2023 1305   ALKPHOS 108 05/07/2023 1305   BILITOT 0.5 05/07/2023 1305   GFRNONAA 57 (L) 05/07/2023 1305     Diabetic Labs (most recent): Lab Results  Component Value Date   HGBA1C 7.2 (H) 03/13/2023     Lipid Panel ( most recent) Lipid Panel     Component Value Date/Time   CHOL 103 03/13/2023 0856   TRIG 66.0 03/13/2023 0856   HDL 32.20 (L) 03/13/2023 0856   CHOLHDL 3 03/13/2023 0856   VLDL 13.2 03/13/2023 0856   LDLCALC 58 03/13/2023 0856      Lab Results  Component Value Date   TSH 2.100 05/07/2023      Assessment & Plan:   1. Type 2 diabetes mellitus with stage 3a chronic kidney disease, with long-term current use of insulin (HCC) (Primary)  - [name] [name] has currently uncontrolled symptomatic type 2 DM since  58 years of age.   She presents with her CGM which she did not use consistently showing 4% time in range, 24% level 1 hyperglycemia, 72% level 2 hyperglycemia.  This presentation is worse than her last presentation.  Her recent A1c was 13.2%.  Relevant, patient's documents near target glycemic profile on a piece of paper she brought with her showing a completely different profile.  These are from fingerstick glucose monitoring average blood glucose between 150-182 fasting, 129 up to 200 postprandial.  His presentation is confusing, suspicious and discordant.   - I had a long discussion with her about the possible risk factors and  the pathology behind diabetes and its complications. -her diabetes is complicated by CVA, obesity/sedentary life, polypharmacy, comorbid hypertension and hyperlipidemia and she remains at a high risk for more acute and chronic complications which include CAD, CVA, CKD, retinopathy, and neuropathy. These are all discussed in detail with her.  - I discussed all available options of managing her diabetes  including de-escalation of medications.    Patient is encouraged to switch to  unprocessed or minimally processed  complex starch, adequate protein intake (mainly plant source), minimal liquid fat, plenty of fruits, and vegetables. -  she is advised to stick to a routine mealtimes to eat 3 complete meals a day and snack only when necessary (to snack only to correct hypoglycemia BG <70 day time or <100 at night).   - she acknowledges that there is a room for improvement in her food and drink choices. - Further Specific Suggestion is made for her to avoid simple carbohydrates  from her diet including Cakes, Sweet Desserts, Ice Cream, Soda (diet and regular), Sweet Tea, Candies, Chips, Cookies, Store Bought Juices, Alcohol ,  Artificial Sweeteners,  Coffee Creamer, and Sugar-free Products. This will help patient to have more stable blood glucose profile and potentially avoid unintended weight gain.   - she will be scheduled with Penny Crumpton, RDN, CDE for individualized diabetes education.  - I have approached her with the following individualized plan to manage  her diabetes and patient agrees:   - The discordant glycemic presentation makes it difficult to adjust her insulin safely.  I advised her to increase her Basaglar to 60 units nightly, discussed and increase her Ozempic to 1 mg subcutaneously weekly.  This medication will be maximized as she tolerates.   -  If she commits for safety, she will be considered for prandial insulin.  Her insurance did not provide coverage for [place].   Patient is not a candidate for metformin at this time.  - she is encouraged to call clinic for blood glucose levels less than 70 or above 200 mg per DL weekly average.   - she will be considered for incretin therapy as appropriate next visit.  - Specific targets for  A1c;  LDL, HDL,  and Triglycerides were discussed with the patient.  2) Blood Pressure /Hypertension:   Her blood pressure is  controlled to target.   There is question of compliance with 7-8 medications for blood pressure in this patient.  she is advised to continue her current medications including valsartan  160 mg p.o. daily with breakfast, as well as Aldactone  50 mg p.o. once daily .  During her last visit, she is advised to bring all of her medications, however patient did not bring her medications for reconciliation.    If noncompliance is ruled out, she will be considered for workup for secondary cause of hypertension.  3) Lipids/Hyperlipidemia:   Review of her recent lipid panel showed  controlled  LDL at 58 .  she  is advised to continue Lipitor 80 mg p.o. daily at bedtime. Side effects and precautions discussed with her.  4)  Weight/Diet:  Body mass index is 36.63 kg/m.  -   clearly complicating her diabetes care.   she is  a candidate for weight loss. I discussed with her the fact that loss of 5 - 10% of her  current body weight will have the most impact on her diabetes management.  The above detailed  ACLM recommendations for nutrition, exercise, sleep, social life, avoidance of risky substances, the need for restorative sleep  information is also detailed on discharge instructions.  5) Chronic Care/Health Maintenance:  -she  is on ACEI/ARB and Statin medications and  is encouraged to initiate and continue to follow up with Ophthalmology, Dentist,  Podiatrist at least yearly or according to recommendations, and advised to   stay away from smoking. I have recommended yearly flu vaccine and pneumonia vaccine at least every 5 years; moderate intensity exercise for up to 150 minutes weekly; and  sleep for 7- 9 hours a day.  - she is  advised to maintain close follow up with Marlee Lynwood NOVAK, MD for primary care needs, as well as her other providers for optimal and coordinated care.  I spent  30  minutes in the care of the patient today including review of labs from CMP, Lipids, Thyroid  Function, Hematology (current  and previous including abstractions from other facilities); face-to-face time discussing  her blood glucose readings/logs, discussing hypoglycemia and hyperglycemia episodes and symptoms, medications doses, her options of short and long term treatment based on the latest standards of care / guidelines;  discussion about incorporating lifestyle medicine;  and documenting the encounter. Risk reduction counseling performed per USPSTF guidelines to reduce  obesity and cardiovascular risk factors.     Please refer to Patient Instructions for Blood Glucose Monitoring and Insulin/Medications Dosing Guide  in media tab for additional information. Please  also refer to  Patient Self Inventory in the Media  tab for reviewed elements of pertinent patient history.  [name] [name] participated in the discussions, expressed understanding, and voiced agreement with the above plans.  All questions were answered to her satisfaction. she is encouraged to contact clinic should she have any questions  or concerns prior to her return visit.    Follow up plan: - Return in about 3 months (around 10/17/2024) for Bring Meter/CGM Device/Logs- A1c in Office.  Ranny Earl, MD [facility] [address] [place], [place] [address] Phone: [phone]  Fax: [phone]    07/17/2024, 6:00 PM  This note was partially dictated with voice recognition software. Similar sounding words can be transcribed inadequately or may not  be corrected upon review.

## 2024-07-17 NOTE — Patient Instructions (Signed)

## 2024-07-21 ENCOUNTER — Encounter: Attending: "Endocrinology | Admitting: Nutrition

## 2024-07-21 ENCOUNTER — Encounter: Payer: Self-pay | Admitting: Nutrition

## 2024-07-21 DIAGNOSIS — N182 Chronic kidney disease, stage 2 (mild): Secondary | ICD-10-CM | POA: Insufficient documentation

## 2024-07-21 DIAGNOSIS — Z794 Long term (current) use of insulin: Secondary | ICD-10-CM | POA: Diagnosis present

## 2024-07-21 DIAGNOSIS — E1122 Type 2 diabetes mellitus with diabetic chronic kidney disease: Secondary | ICD-10-CM | POA: Diagnosis present

## 2024-07-21 DIAGNOSIS — N1831 Chronic kidney disease, stage 3a: Secondary | ICD-10-CM | POA: Diagnosis present

## 2024-07-21 NOTE — Progress Notes (Signed)
 Medical Nutrition Therapy  Appointment Start time:  [phone]  Appointment End time:  1055  Primary concerns today: DM Type 2  Referral diagnosis: E11.8 Preferred learning style: No Preference  Learning readiness: Ready    NUTRITION ASSESSMENT  58 yr old bfemale referred for Type 2 DM uncontrolled.  Sees Dr. Lenis, Endocrinology  Currently on 60 units of Lantus and 1.0 mg of Ozempic. Has LIbre 3. Had issues with them staying on. [name] [name] today shows the sensor she has on- 1-% TIR<, 54% High and 36% Very hight. NO low blood sugars. Has been working on being more consistent with medications and getting rid of processed foods and red meat. Drinks only water Cares for her mom who is on dialysis and a son who is autistic. Strong family hx of CKD-mom, gfather and uncle have all been on dialysis.   She is willing to work on eating more whole plant based foods to improve her Dm, CKD and weight. She is wanting to get and exercise bike to exercise at home.   Clinical Medical Hx:  Past Medical History:  Diagnosis Date   Chronic kidney disease (CKD)    Self reported as stage 3   Diabetes mellitus without complication (HCC)    Hypertensive heart disease    Hypertensive urgency    Left ventricular hypertrophy    Lymphedema    Resistant Hypertension    sees [facility]    Medications:  Current Outpatient Medications on File Prior to Visit  Medication Sig Dispense Refill   albuterol (PROVENTIL HFA;VENTOLIN HFA) 108 (90 Base) MCG/ACT inhaler Inhale 1 puff into the lungs every 4 (four) hours as needed for wheezing or shortness of breath.     amLODipine  (NORVASC ) 10 MG tablet Take 1 tablet (10 mg total) by mouth daily. 90 tablet 3   aspirin EC 81 MG tablet Take 81 mg by mouth daily.     atorvastatin (LIPITOR) 80 MG tablet Take 80 mg by mouth daily.     carvedilol  (COREG ) 25 MG tablet Take 2 tablets (50 mg total) by mouth 2 (two) times daily. 120 tablet 6   cloNIDine  (CATAPRES  - DOSED IN MG/24  HR) 0.3 mg/24hr patch PLACE 1 PATCH (0.3 MG TOTAL) ONTO THE SKIN ONCE A WEEK. 12 patch 3   fluticasone (FLONASE) 50 MCG/ACT nasal spray Place 2 sprays into both nostrils daily as needed.     furosemide  (LASIX ) 40 MG tablet Take one tab (40mg ) alternating with 1/2 tab (20mg ) every other day     hydrALAZINE  (APRESOLINE ) 50 MG tablet Take 1.5 tablets (75 mg total) by mouth 3 (three) times daily. 135 tablet 6   Insulin Glargine (BASAGLAR KWIKPEN) 100 UNIT/ML Inject 60 Units into the skin at bedtime.     isosorbide  mononitrate (IMDUR ) 30 MG 24 hr tablet TAKE 1 TABLET BY MOUTH EVERY DAY 90 tablet 0   Semaglutide, 1 MG/DOSE, 4 MG/3ML SOPN Inject 1 mg as directed once a week. 3 mL 3   spironolactone  (ALDACTONE ) 50 MG tablet TAKE 1 TABLET BY MOUTH EVERY DAY 90 tablet 3   valsartan  (DIOVAN ) 160 MG tablet Take 1 tablet (160 mg total) by mouth daily. 90 tablet 3   acetaminophen (TYLENOL) 500 MG tablet Take 500 mg by mouth every 6 (six) hours as needed.      Multiple Vitamins-Minerals (MULTIVITAMIN WITH MINERALS) tablet Take 1 tablet by mouth daily.     omeprazole (PRILOSEC) 40 MG capsule Take by mouth. (Patient not taking: Reported on 07/09/2024)  No current facility-administered medications on file prior to visit.    Labs:  This SmartLink has not been configured with any valid records.      Latest Ref Rng & Units 05/07/2023    1:05 PM 02/01/2022   12:46 PM 11/23/2021    9:14 AM  CMP  Glucose 70 - 99 mg/dL 870  849  887   BUN 6 - 20 mg/dL 27  21  29    Creatinine 0.44 - 1.00 mg/dL 8.84  8.94  8.69   Sodium 135 - 145 mmol/L 136  138  136   Potassium 3.5 - 5.1 mmol/L 4.3  3.9  3.8   Chloride 98 - 111 mmol/L 104  104  102   CO2 22 - 32 mmol/L 24  27  26    Calcium 8.9 - 10.3 mg/dL 9.0  8.9  9.0   Total Protein 6.5 - 8.1 g/dL 7.9     Total Bilirubin 0.3 - 1.2 mg/dL 0.5     Alkaline Phos 38 - 126 U/L 108     AST 15 - 41 U/L 17     ALT 0 - 44 U/L 20      Lab Results  Component Value Date   HGBA1C  7.2 (H) 03/13/2023   Lipid Panel     Component Value Date/Time   CHOL 103 03/13/2023 0856   TRIG 66.0 03/13/2023 0856   HDL 32.20 (L) 03/13/2023 0856   CHOLHDL 3 03/13/2023 0856   VLDL 13.2 03/13/2023 0856   LDLCALC 58 03/13/2023 0856     Notable Signs/Symptoms: Tired and pees a lot at night  Lifestyle & Dietary Hx Lives with his son and mother.  Estimated daily fluid intake: 4-5 bottles of water per day Supplements: MVI,  Sleep: 5-6 hrs Stress / self-care: stress caring for her mom and austic son(24 yrs) Current average weekly physical activity: ADL  24-Hr Dietary Recall First Meal: 830 am. scrambled eggs and toast, water Snack:  Second Meal: 130 pm chicken salad sandwich with roasted vegetables, water Snack:  Third Meal: 5 pm Vegetable( lima beans, corn, potatoes, carrots, chicken, tomatoes) soup, crackers 5, 1-2 cups, water Snack:  Beverages: water,   Estimated Energy Needs Calories: 1200-1500 Carbohydrate: 135g Protein: 90g Fat: 33g   NUTRITION DIAGNOSIS  NB-1.1 Food and nutrition-related knowledge deficit As related to DM Type 2.  As evidenced by Ac 7.2%.[name] [name]   NUTRITION INTERVENTION  Nutrition education (E-1) on the following topics:  Nutrition and Diabetes education provided on My Plate, CHO counting, meal planning, portion sizes, timing of meals, avoiding snacks between meals unless having a low blood sugar, target ranges for A1C and blood sugars, signs/symptoms and treatment of hyper/hypoglycemia, monitoring blood sugars, taking medications as prescribed, benefits of exercising 30 minutes per day and prevention of complications of DM.  Lifestyle Medicine  - Whole Food, Plant Predominant Nutrition is highly recommended: Eat Plenty of vegetables, Mushrooms, fruits, Legumes, Whole Grains, Nuts, seeds in lieu of processed meats, processed snacks/pastries red meat, poultry, eggs.    -It is better to avoid simple carbohydrates including: Cakes, Sweet Desserts, Ice  Cream, Soda (diet and regular), Sweet Tea, Candies, Chips, Cookies, Store Bought Juices, Alcohol in Excess of  1-2 drinks a day, Lemonade,  Artificial Sweeteners, Doughnuts, Coffee Creamers, Sugar-free Products, etc, etc.  This is not a complete list.....  Exercise: If you are able: 30 -60 minutes a day ,4 days a week, or 150 minutes a week.  The longer the better.  Combine  stretch, strength, and aerobic activities.  If you were told in the past that you have high risk for cardiovascular diseases, you may seek evaluation by your heart doctor prior to initiating moderate to intense exercise programs.   Handouts Provided Include  Know your numbers My Plate   Learning Style & Readiness for Change Teaching method utilized: Visual & Auditory  Demonstrated degree of understanding via: Teach Back  Barriers to learning/adherence to lifestyle change: stress caring for her mom and son  Goals Established by Pt Goals  Eat more vegetables with lunch and dinner Cut out red meat Eat meals on time Increase water Walk 30 minutes 3-4 times per week. Call Dr. Barbette office if [facility] are consistently over 200 mg/dl.   MONITORING & EVALUATION Dietary intake, weekly physical activity, and blood sugars in 1 month.  Next Steps  Patient is to work on eating more whole plant based foods and exercise.[name] [name]

## 2024-07-21 NOTE — Patient Instructions (Signed)
 Goals  Eat more vegetables with lunch and dinner Cut out red meat Eat meals on time Increase water Walk 30 minutes 3-4 times per week. Call Dr. Barbette office if [facility] are consistently over 200 mg/dl.

## 2024-07-23 ENCOUNTER — Encounter: Payer: Self-pay | Admitting: Nurse Practitioner

## 2024-08-11 ENCOUNTER — Encounter: Attending: "Endocrinology | Admitting: Nutrition

## 2024-08-11 ENCOUNTER — Encounter: Payer: Self-pay | Admitting: Nutrition

## 2024-08-11 VITALS — Ht 64.0 in | Wt 203.2 lb

## 2024-08-11 DIAGNOSIS — E1122 Type 2 diabetes mellitus with diabetic chronic kidney disease: Secondary | ICD-10-CM | POA: Insufficient documentation

## 2024-08-11 DIAGNOSIS — Z794 Long term (current) use of insulin: Secondary | ICD-10-CM | POA: Diagnosis present

## 2024-08-11 DIAGNOSIS — N1831 Chronic kidney disease, stage 3a: Secondary | ICD-10-CM | POA: Insufficient documentation

## 2024-08-11 DIAGNOSIS — N182 Chronic kidney disease, stage 2 (mild): Secondary | ICD-10-CM | POA: Diagnosis present

## 2024-08-11 NOTE — Patient Instructions (Addendum)
 Don't adjust insulin doses on your own. Call Dr. Lenis office is blood sugars are consistently over 200's 3 or more times per week. Eat healthy carbs with lunch and dinner. Eat more low carb  vegetables with lunch and dinner Eat meals on time Increase water-80 oz per day Walk 30 minutes 3-4 times per week. Get A1C down to 7%.   Increase Basaglar per Dr. Nida to 80 units at night.

## 2024-08-11 NOTE — Progress Notes (Addendum)
 Medical Nutrition Therapy  Appointment Start time:  1425  Appointment End time: 1500  Primary concerns today: DM Type 2  Referral diagnosis: E11.8 Preferred learning style: No Preference  Learning readiness: Ready    NUTRITION ASSESSMENT  58 yr old bfemale referred for Type 2 DM uncontrolled.  Sees Dr. Lenis, Endocrinology  Currently on 60 units of Lantus and 1.0 mg of Ozempic. Libre shows TIR 3%, 27% high and 70% very high. GMI 10.2% Avg Glu 289 mg/dl. No low blood sugars She notes she has been peeing a lot. Cares for her son who is autistic and her mom on dialysis. She notes she has been cutting back on carbohydrates recently and has been cutting back on her insulin at night if her blood sugars were lower when she   goes to bed.  Reviewed CMG results with Dr. Lenis. He advised to increase her Basaglar to 80 units at night. She denies missing doses.  She has started walking some with her son.  She is willing to work on eating more whole plant based foods to improve her Dm, CKD and weight. She is wanting to get and exercise bike to exercise at home.   Clinical Medical Hx:  Past Medical History:  Diagnosis Date   Chronic kidney disease (CKD)    Self reported as stage 3   Diabetes mellitus without complication (HCC)    Hypertensive heart disease    Hypertensive urgency    Left ventricular hypertrophy    Lymphedema    Resistant Hypertension    sees [facility]    Medications:  Current Outpatient Medications on File Prior to Visit  Medication Sig Dispense Refill   acetaminophen (TYLENOL) 500 MG tablet Take 500 mg by mouth every 6 (six) hours as needed.      albuterol (PROVENTIL HFA;VENTOLIN HFA) 108 (90 Base) MCG/ACT inhaler Inhale 1 puff into the lungs every 4 (four) hours as needed for wheezing or shortness of breath.     amLODipine  (NORVASC ) 10 MG tablet Take 1 tablet (10 mg total) by mouth daily. 90 tablet 3   aspirin EC 81 MG tablet Take 81 mg by mouth daily.      atorvastatin (LIPITOR) 80 MG tablet Take 80 mg by mouth daily.     carvedilol  (COREG ) 25 MG tablet Take 2 tablets (50 mg total) by mouth 2 (two) times daily. 120 tablet 6   cloNIDine  (CATAPRES  - DOSED IN MG/24 HR) 0.3 mg/24hr patch PLACE 1 PATCH (0.3 MG TOTAL) ONTO THE SKIN ONCE A WEEK. 12 patch 3   fluticasone (FLONASE) 50 MCG/ACT nasal spray Place 2 sprays into both nostrils daily as needed.     furosemide  (LASIX ) 40 MG tablet Take one tab (40mg ) alternating with 1/2 tab (20mg ) every other day     hydrALAZINE  (APRESOLINE ) 50 MG tablet Take 1.5 tablets (75 mg total) by mouth 3 (three) times daily. 135 tablet 6   Insulin Glargine (BASAGLAR KWIKPEN) 100 UNIT/ML Inject 60 Units into the skin at bedtime.     isosorbide  mononitrate (IMDUR ) 30 MG 24 hr tablet TAKE 1 TABLET BY MOUTH EVERY DAY 90 tablet 0   Multiple Vitamins-Minerals (MULTIVITAMIN WITH MINERALS) tablet Take 1 tablet by mouth daily.     omeprazole (PRILOSEC) 40 MG capsule Take by mouth. (Patient not taking: Reported on 07/09/2024)     Semaglutide, 1 MG/DOSE, 4 MG/3ML SOPN Inject 1 mg as directed once a week. 3 mL 3   spironolactone  (ALDACTONE ) 50 MG tablet TAKE 1  TABLET BY MOUTH EVERY DAY 90 tablet 3   valsartan  (DIOVAN ) 160 MG tablet Take 1 tablet (160 mg total) by mouth daily. 90 tablet 3   No current facility-administered medications on file prior to visit.    Labs:  This SmartLink has not been configured with any valid records.      Latest Ref Rng & Units 05/07/2023    1:05 PM 02/01/2022   12:46 PM 11/23/2021    9:14 AM  CMP  Glucose 70 - 99 mg/dL 870  849  887   BUN 6 - 20 mg/dL 27  21  29    Creatinine 0.44 - 1.00 mg/dL 8.84  8.94  8.69   Sodium 135 - 145 mmol/L 136  138  136   Potassium 3.5 - 5.1 mmol/L 4.3  3.9  3.8   Chloride 98 - 111 mmol/L 104  104  102   CO2 22 - 32 mmol/L 24  27  26    Calcium 8.9 - 10.3 mg/dL 9.0  8.9  9.0   Total Protein 6.5 - 8.1 g/dL 7.9     Total Bilirubin 0.3 - 1.2 mg/dL 0.5     Alkaline Phos  38 - 126 U/L 108     AST 15 - 41 U/L 17     ALT 0 - 44 U/L 20      Lab Results  Component Value Date   HGBA1C 7.2 (H) 03/13/2023   Lipid Panel     Component Value Date/Time   CHOL 103 03/13/2023 0856   TRIG 66.0 03/13/2023 0856   HDL 32.20 (L) 03/13/2023 0856   CHOLHDL 3 03/13/2023 0856   VLDL 13.2 03/13/2023 0856   LDLCALC 58 03/13/2023 0856     Notable Signs/Symptoms: Tired and pees a lot at night  Lifestyle & Dietary Hx Lives with his son and mother.  Estimated daily fluid intake: 4-5 bottles of water per day Supplements: MVI,  Sleep: 5-6 hrs Stress / self-care: stress caring for her mom and austic son(24 yrs) Current average weekly physical activity: ADL  24-Hr Dietary Recall B) Boiled egg, 1 slice toast, banana, water L) Turkey Sandwich, water  D) Cabbage, Smoked turkey leg, water  Estimated Energy Needs Calories: 1200-1500 Carbohydrate: 135g Protein: 90g Fat: 33g   NUTRITION DIAGNOSIS  NB-1.1 Food and nutrition-related knowledge deficit As related to DM Type 2.  As evidenced by Ac 7.2%.SABRA   NUTRITION INTERVENTION  Nutrition education (E-1) on the following topics:  Nutrition and Diabetes education provided on My Plate, CHO counting, meal planning, portion sizes, timing of meals, avoiding snacks between meals unless having a low blood sugar, target ranges for A1C and blood sugars, signs/symptoms and treatment of hyper/hypoglycemia, monitoring blood sugars, taking medications as prescribed, benefits of exercising 30 minutes per day and prevention of complications of DM.  Lifestyle Medicine  - Whole Food, Plant Predominant Nutrition is highly recommended: Eat Plenty of vegetables, Mushrooms, fruits, Legumes, Whole Grains, Nuts, seeds in lieu of processed meats, processed snacks/pastries red meat, poultry, eggs.    -It is better to avoid simple carbohydrates including: Cakes, Sweet Desserts, Ice Cream, Soda (diet and regular), Sweet Tea, Candies, Chips,  Cookies, Store Bought Juices, Alcohol in Excess of  1-2 drinks a day, Lemonade,  Artificial Sweeteners, Doughnuts, Coffee Creamers, Sugar-free Products, etc, etc.  This is not a complete list.....  Exercise: If you are able: 30 -60 minutes a day ,4 days a week, or 150 minutes a week.  The longer the better.  Combine stretch, strength, and aerobic activities.  If you were told in the past that you have high risk for cardiovascular diseases, you may seek evaluation by your heart doctor prior to initiating moderate to intense exercise programs.   Handouts Provided Include  Know your numbers My Plate   Learning Style & Readiness for Change Teaching method utilized: Visual & Auditory  Demonstrated degree of understanding via: Teach Back  Barriers to learning/adherence to lifestyle change: stress caring for her mom and son  Goals Established by Pt Don't adjust insulin doses on your own. Call Dr. Lenis office is blood sugars are consistently over 200's 3 or more times per week. Eat healthy carbs with lunch and dinner. Eat more low carb  vegetables with lunch and dinner Eat meals on time Increase water-80 oz per day Walk 30 minutes 3-4 times per week. Get A1C down to 7%. Increase Basaglar to 80 units per Dr. Lenis. MONITORING & EVALUATION Dietary intake, weekly physical activity, and blood sugars in 3 month.  Next Steps  Patient is to work on eating more whole plant based foods and exercise.SABRA

## 2024-08-15 ENCOUNTER — Ambulatory Visit: Payer: Self-pay | Admitting: Nurse Practitioner

## 2024-08-15 MED ORDER — HYDRALAZINE HCL 100 MG PO TABS: 100.0000 mg | ORAL_TABLET | Freq: Three times a day (TID) | ORAL | 3 refills | Status: AC

## 2024-08-15 NOTE — Telephone Encounter (Signed)
-----   Message from Almarie Crate sent at 08/15/2024  4:18 PM EST ----- Blood pressures are better, but majority of them are not quite at goal. Please increase hydralazine  to 100 mg TID and then update me in 1-2 weeks with her BP readings. Thanks!  Almarie Crate, AGNP-C ----- Message ----- From: Gretel Darryle RAMAN Sent: 07/23/2024   3:27 PM EST To: Almarie Crate, NP

## 2024-08-15 NOTE — Telephone Encounter (Signed)
 Pt will increase hydralazine  to 100 mg tid, request 100 mg tablet be sent to [facility] [place], she will call back in 1-2 weeks with bp readings

## 2024-10-28 ENCOUNTER — Ambulatory Visit: Admitting: "Endocrinology

## 2024-10-28 ENCOUNTER — Encounter: Admitting: Nutrition

## 2024-11-12 ENCOUNTER — Encounter: Admitting: Nutrition

## 2024-12-10 ENCOUNTER — Ambulatory Visit: Admitting: "Endocrinology
